# Patient Record
Sex: Male | Born: 1975
Health system: Southern US, Community
[De-identification: ages and names within clinical notes are randomized; demographics above are authoritative.]

## PROBLEM LIST (undated history)

## (undated) DIAGNOSIS — I10 Essential (primary) hypertension: Secondary | ICD-10-CM

## (undated) DIAGNOSIS — E785 Hyperlipidemia, unspecified: Secondary | ICD-10-CM

## (undated) HISTORY — DX: Hyperlipidemia, unspecified: E78.5

## (undated) HISTORY — PX: EYE SURGERY: SHX253

---

## 2016-01-30 ENCOUNTER — Emergency Department (INDEPENDENT_AMBULATORY_CARE_PROVIDER_SITE_OTHER): Payer: BLUE CROSS/BLUE SHIELD

## 2016-01-30 ENCOUNTER — Encounter (HOSPITAL_COMMUNITY): Payer: Self-pay | Admitting: Emergency Medicine

## 2016-01-30 ENCOUNTER — Emergency Department (HOSPITAL_COMMUNITY)
Admission: EM | Admit: 2016-01-30 | Discharge: 2016-01-30 | Disposition: A | Discharge status: Home / Self Care | Payer: BLUE CROSS/BLUE SHIELD | Source: Home / Self Care | Attending: Emergency Medicine | Admitting: Emergency Medicine

## 2016-01-30 DIAGNOSIS — IMO0001 Reserved for inherently not codable concepts without codable children: Secondary | ICD-10-CM

## 2016-01-30 DIAGNOSIS — J329 Chronic sinusitis, unspecified: Secondary | ICD-10-CM

## 2016-01-30 DIAGNOSIS — J189 Pneumonia, unspecified organism: Secondary | ICD-10-CM | POA: Diagnosis not present

## 2016-01-30 DIAGNOSIS — R030 Elevated blood-pressure reading, without diagnosis of hypertension: Secondary | ICD-10-CM

## 2016-01-30 LAB — POCT I-STAT, CHEM 8
BUN: 10 mg/dL (ref 6–20)
CHLORIDE: 102 mmol/L (ref 101–111)
Calcium, Ion: 1.2 mmol/L (ref 1.12–1.23)
Creatinine, Ser: 1.1 mg/dL (ref 0.61–1.24)
GLUCOSE: 103 mg/dL — AB (ref 65–99)
HEMATOCRIT: 51 % (ref 39.0–52.0)
HEMOGLOBIN: 17.3 g/dL — AB (ref 13.0–17.0)
POTASSIUM: 3.7 mmol/L (ref 3.5–5.1)
Sodium: 141 mmol/L (ref 135–145)
TCO2: 26 mmol/L (ref 0–100)

## 2016-01-30 MED ORDER — LEVOFLOXACIN 500 MG PO TABS: 500.0000 mg | ORAL_TABLET | Freq: Every day | ORAL | Status: DC

## 2016-01-30 MED ORDER — AMLODIPINE BESYLATE 10 MG PO TABS: 10.0000 mg | ORAL_TABLET | Freq: Every day | ORAL | Status: DC

## 2016-01-30 MED ORDER — PREDNISONE 50 MG PO TABS: ORAL_TABLET | ORAL | Status: DC

## 2016-01-30 NOTE — Discharge Instructions (Signed)
You have pneumonia. Take Levaquin and prednisone as prescribed. This medicine will also help with your sinuses. Take amlodipine daily. This will help with your blood pressure. Please recheck your blood pressure at the drug store in 1 week. Ideally it is below 140/90, but I would be happy with less than 160/100. Please work on finding a primary care doctor.  Calling your insurance company is a good place to start.  AllstateCommunity Resource Guide Financial Assistance The United Ways 211 is a great source of information about community services available.  Access by dialing 2-1-1 from anywhere in [place], or by website -  PooledIncome.plwww.nc211.org.   Other Local Resources (Updated 11/2015)  Financial Assistance   Services    Phone Number and Address  [facility]  Low-cost medical care - 1st and 3rd Saturday of every month  Must not qualify for public or private insurance and must have limited income [phone] [address] [place], [place]    [place] Department of Social Services  Child care  Emergency assistance for housing and Kimberly-Clarkutilities  Food stamps  Medicaid [phone] [address] [place], [place] [address]   [facility]  Low-cost medical care for children, communicable diseases, sexually-transmitted diseases, immunizations, maternity care, womens health and family planning [phone] [address] [place], [place] [address]  [facility]   Medication assistance for [facility] residents  Must meet income requirements [phone] [address] [place], [place].    [facility] Social Services  Child care  Emergency assistance for housing and Kimberly-Clarkutilities  Food stamps  Medicaid [phone] [address] [place], [place] [address]  [facility]   Low-cost medical care,   Monday through Friday, 9 am to 6 pm.   Accepts  Medicare/Medicaid, and self-pay [phone] [address] [place], [place] [address]  [facility]  Low-cost medical care - Monday through Friday, 8:30 am - 5:30 pm  Accepts Medicaid and self-pay [phone] [address] [place], [place] [address]   [facility]  Primary medical care, including for those with sickle cell disease  Accepts Medicare, Medicaid, insurance and self-pay [phone] [address] [place], [place]  [facility]   Primary medical care  Accepts Medicare, [place], insurance and self-pay [phone] [address] [place], [place] [address]   [facility] Department of Social Services  Child care  Emergency assistance for housing and Kimberly-Clarkutilities  Food stamps  Medicaid [phone] [address] [place], [place] [address]  [facility] Department of Health and [employer]Human Services  Child care  Emergency assistance for housing and Kimberly-Clarkutilities  Food stamps  Medicaid [phone] [address] [place], [place] [address]   [facility]  Medication assistance for [facility] residents with no insurance only  Must have a primary care doctor [phone] [address] [place], [place]  [facility]   Primary medical care  [place] Medicare, [place], insurance  [phone] [place], [place]  [facility]ist   Medication assistance [phone]  [facility]   Primary medical care  Accepts Medicare, [place], insurance and self-pay [phone] [address] [place], [place] [address]  [facility]   Primary medical care  Accepts Medicare, [place], insurance and self-pay [phone] [address] [place], [place] [address]  [facility] [place]  residents between the ages of 55 and 55 who do not have any  form of health insurance, Medicare, Medicaid, or VA benefits.  Services are provided free of charge to uninsured patients who fall within federal poverty guidelines.    Hours: Tuesdays and Thursdays, 4:15 - 8 pm [phone] [address] [place], [place] [address]  [facility]     Primary medical care  Dental care  Nutritional counseling  Pharmacy  Accepts Medicaid, Medicare, most insurance.  Fees are adjusted based on ability to pay.   [phone] [facility] [address] [place], [place]  [phone] [facility] [address] [place], [place]  [phone] [facility] [place], [place]  [phone] [facility], [address] [place], [place]  [phone] [facility] [address] [place], [place]  [facility]  Womens health and family planning [phone] [address] [place] [place], [place]  [facility] Department of Social Services  Child care  Emergency assistance for housing and [employer]ark  Medicaid [phone] N[address], [place], [place] [address]   [facility]    Ages 28 and older  Hours: Mondays and Thursdays, 7:00 am - 9:00 am Patients are seen on a first come, first served basis. [phone], [phone] [place], [place]  [facility] Division of Social Services  Child care  Emergency assistance for housing and [employer]ark  Medicaid [phone] [address] [place], [place] [address]  [facility]  Medication assistance  Rental assistance  Food pantry  Medication assistance  Housing assistance  Emergency food distribution  Utility assistance [phone] [address] [place], [place]  [phone]  [address] [place], [place] [address] Hours: Tuesdays and Thursdays from 9am - 12  noon by appointment only  [phone] [address] [place], [place] [address]  [facility] Lanae Boast   Accepts private insurance, [place], and [place].  Payment is based on a sliding scale for those without insurance.  Hours: Mondays, Tuesdays and Thursdays, 8:30 am - 5:30 pm.   [phone] [address] [place], [place]  [facility] - Family Medicine at [facility], [place], and [place].  Payment is based on a sliding scale for those without insurance. [phone] [address] [place], [place]  [facility] - Pediatrics at E. Scientist, research (physical sciences), [employer], and [place].  Payment is based on a sliding scale for those without insurance [phone] [address], [employer], [place]  [facility] - Pediatrics at [employer], [place], and [place].  Payment is based on a sliding scale for those without insurance. [phone] [address] [place], [place]  [facility] - Pediatrics at [facility], [place], and [place].  Payment is based on a sliding scale for those without insurance. [phone], [phone] [place], [place].    [facility]  Maternity care.  Accepts Medicaid and self-pay. [phone] [address] [place], [place]

## 2016-01-30 NOTE — ED Notes (Signed)
[facility] a [facility] and nasal congestion x 1 month.

## 2016-01-30 NOTE — ED Provider Notes (Signed)
CSN: [id number]     Arrival date & time 01/30/16  1423 History   First MD Initiated Contact with Patient 01/30/16 1610     Chief Complaint  Patient presents with  . Nasal Congestion  . Cough   (Consider location/radiation/quality/duration/timing/severity/associated sxs/prior Treatment) HPI Francisco King is a 40 y.o. male patient presenting with sinus symptoms. 1 month ago he became sick with a URI that he felt was the flu. Since then, his fever and body aches have resolved, but for the past two weeks, he continues to have cough, sinus pressure and congestion and green nasal drainage. The sinus pressure causes him to feel "dazed;" his appetite is down a bit and he cannot smell or taste his food as well as normal. He reports chills that have resolved, headache, hoarse voice from breathing through his mouth, 3/10 sore throat, and occasional nosebleeds when blowing his nose. He denies fever, vision changes, ear pain, SOB, DOE, wheezes, chest pain, nausea/vomiting/diarrhea/constipation. He has tried Tylenol, Alka-Seltzer day/night, and Benadryl, tea with lemon and cayenne pepper, with little relief.  He did not get a flu shot. He has not seen a doctor in at least 6 years. He smokes cigarettes and marijuana, occasional alcohol use, but has never done chewing tobacco. He reports family history of HTN, DM, CA.   History reviewed. No pertinent past medical history. Past Surgical History  Procedure Laterality Date  . Eye surgery     History reviewed. No pertinent family history. Social History  Substance Use Topics  . Smoking status: Current Every Day Smoker -- 0.50 packs/day for 15 years    Types: Cigarettes  . Smokeless tobacco: None  . Alcohol Use: No    Review of Systems See HPI.  Allergies  Review of patient's allergies indicates no known allergies.  Home Medications   Prior to Admission medications   Medication Sig Start Date End Date Taking? Authorizing Provider  amLODipine  (NORVASC) 10 MG tablet Take 1 tablet (10 mg total) by mouth daily. 01/30/16   Charm Rings, MD  levofloxacin (LEVAQUIN) 500 MG tablet Take 1 tablet (500 mg total) by mouth daily. 01/30/16   Charm Rings, MD  predniSONE (DELTASONE) 50 MG tablet Take 1 pill daily for 5 days. 01/30/16   Charm Rings, MD   Meds Ordered and Administered this Visit  Medications - No data to display  BP 182/118 mmHg  Pulse 78  Temp(Src) 97.6 F (36.4 C) (Oral)  SpO2 99% No data found.   Physical Exam Gen: Overweight and muscular male, no acute distress, appears mildly fatigued, cooperative, voice is mildly raspy Eyes: PERRL, EOM's intact, no conjunctival injection or scleral icterus ENT: TM's gray and intact; nasal turbinates erythematous, but non-swollen, and no polyps; no frontal/maxillary sinus tenderness to palpation/percussion; oropharynx erythematous with drainage, left tonsil is 2+ and much larger than the right, but without exudates; no uvular deviation. Neck: No cervical lymphadenopathy CV: RRR, no murmurs/rubs/gallops Lungs: Normal respiratory effort; inspiratory wheeze and expiratory rhonchi right lower lobe; expiratory rhonchi in bases of lower lobes bilaterally; bronchospasm with coughing Abd: Soft, NTND, bowel sounds present Skin: No rash noted Psych: Normal mood and affect  ED Course  Procedures (including critical care time)  Labs Review Labs Reviewed  POCT I-STAT, CHEM 8 - Abnormal; Notable for the following:    Glucose, Bld 103 (*)    Hemoglobin 17.3 (*)    All other components within normal limits    Imaging Review Dg Chest 2 View  01/30/2016  CLINICAL DATA:  Cough and congestion EXAM: CHEST  2 VIEW COMPARISON:  None. FINDINGS: Cardiomediastinal silhouette is unremarkable. There is infiltrate/ pneumonia in right lower lobe posterior lateral segment. Follow-up to resolution after appropriate treatment is recommended. Left lung is clear. No pulmonary edema. IMPRESSION:  Infiltrate/pneumonia in left lower lobe posterior lateral segment. Follow-up to resolution after appropriate treatment is recommended. Electronically Signed   By: Natasha MeadLiviu  Pop M.D.   On: 01/30/2016 17:54     Visual Acuity Review  Right Eye Distance:   Left Eye Distance:   Bilateral Distance:    Right Eye Near:   Left Eye Near:    Bilateral Near:         MDM   1. CAP (community acquired pneumonia)   2. Elevated BP   3. Other sinusitis    CXR shows "Infiltrate/pneumonia in right lower lobe posterior lateral segment." Will prescribe Levaquin and Prednisone for CAP. His blood pressure was 182/118 today in clinic. BUN 10, Creatinine 1.10. Clinically stable, no headache, vision changes. I will start him on amlodipine until he establishes with PCP.   We discussed the need to establish with a PCP and I have given him resources/information to help him find one. I would like him to follow-up soon with a PCP to manage his blood pressure and to re-check his tonsils. In the meantime he can re-check his BP at a pharmacy with goal of 140/90, but I would be happy with less than 160/100.  Patient agreed with this plan.    Charm RingsErin J Oneka Parada, MD 01/30/16 [id number]

## 2016-02-20 ENCOUNTER — Ambulatory Visit (HOSPITAL_COMMUNITY)
Admission: EM | Admit: 2016-02-20 | Discharge: 2016-02-20 | Disposition: A | Discharge status: Home / Self Care | Payer: BLUE CROSS/BLUE SHIELD | Attending: Emergency Medicine | Admitting: Emergency Medicine

## 2016-02-20 ENCOUNTER — Encounter (HOSPITAL_COMMUNITY): Payer: Self-pay | Admitting: Emergency Medicine

## 2016-02-20 DIAGNOSIS — H6592 Unspecified nonsuppurative otitis media, left ear: Secondary | ICD-10-CM

## 2016-02-20 DIAGNOSIS — R0981 Nasal congestion: Secondary | ICD-10-CM | POA: Diagnosis not present

## 2016-02-20 HISTORY — DX: Essential (primary) hypertension: I10

## 2016-02-20 MED ORDER — CETIRIZINE HCL 10 MG PO TABS: 10.0000 mg | ORAL_TABLET | Freq: Every day | ORAL | Status: DC

## 2016-02-20 MED ORDER — AMLODIPINE BESYLATE 10 MG PO TABS: 10.0000 mg | ORAL_TABLET | Freq: Every day | ORAL | Status: DC

## 2016-02-20 MED ORDER — PREDNISONE 50 MG PO TABS: ORAL_TABLET | ORAL | Status: DC

## 2016-02-20 NOTE — Discharge Instructions (Signed)
I'm glad you are feeling better. You do still have some congestion of your sinuses. This has caused some fluid behind your left ear. Some of this may be coming from all the pollen out right now. Take prednisone daily for another 5 days. I would like you to get Zyrtec and take that daily for the next week. Continue the amlodipine for a total of 3 months. Recheck your blood pressure after you have been off the amlodipine to make sure it is staying below 140/90.

## 2016-02-20 NOTE — ED Notes (Signed)
PT was seen here on 3/26. PT reports a lingering cough and ear pain and pressure in left ear. Pt reports he feels as though there is fluid in his left ear. PT reports lingering yellow congestion and sometimes produces green/yellow mucus with cough. PT reports he is feeling better, but the pressure in his ear and face is really bothering him

## 2016-02-20 NOTE — ED Provider Notes (Signed)
CSN: [id number]     Arrival date & time 02/20/16  1531 History   First MD Initiated Contact with Patient 02/20/16 1554     Chief Complaint  Patient presents with  . URI   (Consider location/radiation/quality/duration/timing/severity/associated sxs/prior Treatment) HPI He is a 40 year old man here for evaluation of sinus congestion. He was seen here about 3 weeks ago and diagnosed with pneumonia. He has completed the Levaquin and prednisone that was prescribed. He states overall he is feeling much better, but he has continued to have sinus congestion and pressure. He also reports some pressure in his left ear. He continues to have an intermittent cough, but it is much improved. He states his energy level is back, but he does fatigue a little easily. He has not been taking any medications.  He is still taking the amlodipine.  Past Medical History  Diagnosis Date  . Hypertension    Past Surgical History  Procedure Laterality Date  . Eye surgery     No family history on file. Social History  Substance Use Topics  . Smoking status: Current Every Day Smoker -- 0.50 packs/day for 15 years    Types: Cigarettes  . Smokeless tobacco: None  . Alcohol Use: No    Review of Systems As in history of present illness Allergies  Review of patient's allergies indicates no known allergies.  Home Medications   Prior to Admission medications   Medication Sig Start Date End Date Taking? Authorizing Provider  amLODipine (NORVASC) 10 MG tablet Take 1 tablet (10 mg total) by mouth daily. 02/20/16   Charm RingsErin J Morelia Cassells, MD  cetirizine (ZYRTEC) 10 MG tablet Take 1 tablet (10 mg total) by mouth daily. 02/20/16   Charm RingsErin J Serrina Minogue, MD  predniSONE (DELTASONE) 50 MG tablet Take 1 pill daily for 5 days. 02/20/16   Charm RingsErin J Donn Zanetti, MD   Meds Ordered and Administered this Visit  Medications - No data to display  BP 144/89 mmHg  Pulse 93  Temp(Src) 99.4 F (37.4 C) (Oral)  Resp 18  SpO2 96% No data  found.   Physical Exam  Constitutional: He is oriented to person, place, and time. He appears well-developed and well-nourished. No distress.  HENT:  Mouth/Throat: No oropharyngeal exudate.  onsils are symmetric.He does have some mild oropharyngeal erythema. Nasal mucosa is inflamed. Right TM is normal. Left TM has a clear effusion.  Neck: Neck supple.  Cardiovascular: Normal rate, regular rhythm and normal heart sounds.   No murmur heard. Pulmonary/Chest: Effort normal and breath sounds normal. No respiratory distress. He has no wheezes. He has no rales.  Lymphadenopathy:    He has no cervical adenopathy.  Neurological: He is alert and oriented to person, place, and time.    ED Course  Procedures (including critical care time)  Labs Review Labs Reviewed - No data to display  Imaging Review No results found.   MDM   1. Sinus congestion   2. Middle ear effusion, left    Lingering post infectious inflammation versus allergies. We'll treat with another 5 days of prednisone. Recommended taking daily Zyrtec. Recommended he continue the amlodipine for a total of 3 months. Once he stops taking the amlodipine, he should recheck his blood pressure to make sure it stays normal.    Charm RingsErin J Jessilyn Catino, MD 02/20/16 [phone]

## 2016-04-24 ENCOUNTER — Ambulatory Visit (INDEPENDENT_AMBULATORY_CARE_PROVIDER_SITE_OTHER): Payer: BLUE CROSS/BLUE SHIELD | Admitting: Family Medicine

## 2016-04-24 ENCOUNTER — Encounter: Payer: Self-pay | Admitting: Family Medicine

## 2016-04-24 VITALS — BP 116/85 | HR 72 | Temp 97.8°F | Resp 12 | Ht 69.0 in | Wt 270.0 lb

## 2016-04-24 DIAGNOSIS — I10 Essential (primary) hypertension: Secondary | ICD-10-CM | POA: Diagnosis not present

## 2016-04-24 DIAGNOSIS — Z6839 Body mass index (BMI) 39.0-39.9, adult: Secondary | ICD-10-CM | POA: Diagnosis not present

## 2016-04-24 DIAGNOSIS — J309 Allergic rhinitis, unspecified: Secondary | ICD-10-CM | POA: Insufficient documentation

## 2016-04-24 MED ORDER — AMLODIPINE BESYLATE 10 MG PO TABS: 10.0000 mg | ORAL_TABLET | Freq: Every day | ORAL | Status: DC

## 2016-04-24 MED ORDER — FLUTICASONE PROPIONATE 50 MCG/ACT NA SUSP: 1.0000 | Freq: Two times a day (BID) | NASAL | Status: DC

## 2016-04-24 NOTE — Progress Notes (Signed)
Pre visit review using our clinic review tool, if applicable. No additional management support is needed unless otherwise documented below in the visit note. 

## 2016-04-24 NOTE — Progress Notes (Signed)
HPI:   [name] is a 40 y.o. male, who is here today to establish care with me.  Former PCP: Dr Francisco King  Last preventive routine visit: Not sure.  Concerns today: feels like he has water in his left ear. No hearing loss. 5-6 months of intermittent rhinorrhea and occasional non productive cough. + Nasal congestion and sneezing. Not sure about Hx of allergies, Rx for Zyrtedcwas given a few months ago but he did not continue it.   Hypertension: Dx a couple years ago but just recently started pharmacologic treatment. Currently he is on Amlodipine 10 mg daily. He is taking medications as instructed, no side effects reported.  He has not noted unusual headache, visual changes, exertional chest pain, dyspnea,  focal weakness, or edema.    Lab Results  Component Value Date   CREATININE 1.10 01/30/2016   BUN 10 01/30/2016   NA 141 01/30/2016   K 3.7 01/30/2016   CL 102 01/30/2016    + Smoker. He exercise regularly but he is not consistent with his diet. He would like labs done today, fasting.    Review of Systems  Constitutional: Negative for fever, activity change, appetite change, fatigue and unexpected weight change.  HENT: Positive for congestion, postnasal drip, rhinorrhea and sneezing. Negative for nosebleeds, sore throat and trouble swallowing.   Eyes: Positive for itching. Negative for redness and visual disturbance.  Respiratory: Positive for cough. Negative for apnea, shortness of breath and wheezing.   Cardiovascular: Negative for chest pain, palpitations and leg swelling.  Gastrointestinal: Negative for nausea, vomiting and abdominal pain.       No changes in bowel habits.  Genitourinary: Negative for dysuria, hematuria and decreased urine volume.  Musculoskeletal: Positive for back pain (occasionally, associated with wt lifting.).  Allergic/Immunologic: Positive for environmental allergies.  Neurological: Negative for dizziness, seizures,  weakness, numbness and headaches.  Hematological: Negative for adenopathy. Does not bruise/bleed easily.  Psychiatric/Behavioral: Negative for confusion and sleep disturbance. The patient is not nervous/anxious.       Current Outpatient Prescriptions on File Prior to Visit  Medication Sig Dispense Refill  . cetirizine (ZYRTEC) 10 MG tablet Take 1 tablet (10 mg total) by mouth daily. 30 tablet 0   No current facility-administered medications on file prior to visit.     Past Medical History  Diagnosis Date  . Hypertension    No Known Allergies  History reviewed. No pertinent family history.  Social History   Social History  . Marital Status: Single    Spouse Name: N/A  . Number of Children: N/A  . Years of Education: N/A   Social History Main Topics  . Smoking status: Current Every Day Smoker -- 0.50 packs/day for 15 years    Types: Cigarettes  . Smokeless tobacco: None  . Alcohol Use: No  . Drug Use: No  . Sexual Activity: Not Asked   Other Topics Concern  . None   Social History Narrative    Filed Vitals:   04/24/16 1519  BP: 116/85  Pulse: 72  Temp: 97.8 F (36.6 C)  Resp: 12    Body mass index is 39.85 kg/(m^2).   SpO2 Readings from Last 3 Encounters:  04/24/16 98%  02/20/16 96%  01/30/16 99%      Physical Exam  Constitutional: He is oriented to person, place, and time. He appears well-developed. No distress.  HENT:  Head: Atraumatic.  Right Ear: Hearing, tympanic membrane and ear canal normal.  Left Ear:  Hearing, tympanic membrane and ear canal normal.  Nose: Right sinus exhibits no maxillary sinus tenderness and no frontal sinus tenderness. Left sinus exhibits no maxillary sinus tenderness and no frontal sinus tenderness.  Mouth/Throat: Uvula is midline, oropharynx is clear and moist and mucous membranes are normal.  Hypertrophic turbinates.  Eyes: Conjunctivae and EOM are normal. Pupils are equal, round, and reactive to light.    Cardiovascular: Normal rate and regular rhythm.   No murmur heard. Pulses:      Dorsalis pedis pulses are 2+ on the right side, and 2+ on the left side.  Respiratory: Effort normal and breath sounds normal. No respiratory distress.  GI: Soft. He exhibits no mass. There is no hepatomegaly. There is no tenderness.  Musculoskeletal: He exhibits no edema or tenderness.   No tenderness upon palpation of paraspinal muscles.     Lymphadenopathy:    He has no cervical adenopathy.  Neurological: He is alert and oriented to person, place, and time. He has normal strength.  Skin: Skin is warm. No erythema.  Psychiatric: He has a normal mood and affect.  Well groomed, good eye contact.      ASSESSMENT AND PLAN:     Francisco King was seen today for new patient (initial visit).  Diagnoses and all orders for this visit:  Essential hypertension, benign  Adequately controlled. No changes in current management. We may be able to decrease dose of Amlodipine if he loses some wt and improves diet. DASH diet recommended. Eye exam recommended annually. F/U in 4 months, before if needed.  -     Lipid panel -     TSH -     Hemoglobin A1c -     Basic metabolic panel -     amLODipine (NORVASC) 10 MG tablet; Take 1 tablet (10 mg total) by mouth daily.  BMI 39.0-39.9,adult  We discussed benefits of wt loss as well as adverse effects of obesity. Consistency with healthy diet and physical activity recommended. Healthy diet + daily brisk walking for 15-30 min as tolerated.   -     Lipid panel -     Hemoglobin A1c  Allergic rhinitis, unspecified allergic rhinitis type  Flonase nasal spray and OTC Allegra 180 mg or Zyrtec 10 mg daily. Avoid decongestants. F/U in 4 months.  -     fluticasone (FLONASE) 50 MCG/ACT nasal spray; Place 1 spray into both nostrils 2 (two) times daily.         -PMHx reviewed. -Will obtain records from former PCP.        Francisco Hartmann G. [place], MD  [facility]. [place] office.

## 2016-04-24 NOTE — Patient Instructions (Signed)
A few things to remember from today's visit:   1. Essential hypertension, benign  - Lipid panel - TSH - Hemoglobin A1c - Basic metabolic panel - amLODipine (NORVASC) 10 MG tablet; Take 1 tablet (10 mg total) by mouth daily.  Dispense: 90 tablet; Refill: 1  2. BMI 39.0-39.9,adult  - Lipid panel - Hemoglobin A1c  3. Allergic rhinitis, unspecified allergic rhinitis type  - fluticasone (FLONASE) 50 MCG/ACT nasal spray; Place 1 spray into both nostrils 2 (two) times daily.  Dispense: 16 g; Refill: 3   What are some tips for weight loss? People become overweight for many reasons. Weight issues can run in families. They can be caused by unhealthy behaviors and a person's environment. Certain health problems and medicines can also lead to weight gain. There are some simple things you can do to reach and maintain a healthy weight:  Eat 500 fewer calories per day than your body needs to maintain your weight. Women should aim for no more than 1,200 to 1,500 calories per day. Men should aim for 1,500 to 1,800 calories per day. Avoid sweet drinks. These include regular soft drinks, fruit juices, fruit drinks, energy drinks, sweetened iced tea, and flavored milk. Avoid fast foods. Fast foods such as french fries, hamburgers, chicken nuggets, and pizza are high in calories and can cause weight gain. Eat a healthy breakfast. People who skip breakfast tend to weigh more. Don't watch more than two hours of television per day. Chew sugar-free gum between meals to cut down on snacking. Avoid grocery shopping when you're hungry. Pack a healthy lunch instead of eating out to control what and how much you eat. Eat a lot of fruits and vegetables. Aim for about 2 cups of fruit and 2 to 3 cups of vegetables per day. Aim for 150 minutes per week of moderate-intensity exercise (such as brisk walking), or 75 minutes per week of vigorous exercise (such as jogging or running). Be more active. Small changes in  physical activity can easily be added to your daily routine. For example, take the stairs instead of the elevator. Take a walk with your family. A daily walk is a great way to get exercise and to catch up on the day's events.  DASH diet recommended: high in vegetables, fruits, low-fat dairy products, whole grains, poultry, fish, and nuts; and low in sweets, sugar-sweetened beverages, and red meats.     If you sign-up for My chart, you can communicate easier with us in case you have any question or concern.

## 2016-04-25 LAB — LIPID PANEL
CHOL/HDL RATIO: 6
CHOLESTEROL: 206 mg/dL — AB (ref 0–200)
HDL: 33.1 mg/dL — ABNORMAL LOW (ref >39.00)
NonHDL: 172.74
TRIGLYCERIDES: 206 mg/dL — AB (ref 0.0–149.0)
VLDL: 41.2 mg/dL — ABNORMAL HIGH (ref 0.0–40.0)

## 2016-04-25 LAB — TSH: TSH: 1.12 u[IU]/mL (ref 0.35–4.50)

## 2016-04-25 LAB — LDL CHOLESTEROL, DIRECT: Direct LDL: 130 mg/dL

## 2016-04-25 LAB — BASIC METABOLIC PANEL
BUN: 10 mg/dL (ref 6–23)
CALCIUM: 9.9 mg/dL (ref 8.4–10.5)
CO2: 26 meq/L (ref 19–32)
Chloride: 105 mEq/L (ref 96–112)
Creatinine, Ser: 1.1 mg/dL (ref 0.40–1.50)
GFR: 79.54 mL/min (ref >60.00)
Glucose, Bld: 89 mg/dL (ref 70–99)
POTASSIUM: 3.6 meq/L (ref 3.5–5.1)
SODIUM: 138 meq/L (ref 135–145)

## 2016-04-25 LAB — HEMOGLOBIN A1C: HEMOGLOBIN A1C: 5.4 % (ref 4.6–6.5)

## 2016-04-26 ENCOUNTER — Telehealth: Payer: Self-pay | Admitting: Family Medicine

## 2016-04-26 NOTE — Telephone Encounter (Signed)
Called pt back, see other phone note. 

## 2016-04-26 NOTE — Telephone Encounter (Signed)
Pt is returning sarah call °

## 2016-08-07 ENCOUNTER — Encounter: Payer: Self-pay | Admitting: Family Medicine

## 2016-08-07 ENCOUNTER — Ambulatory Visit (INDEPENDENT_AMBULATORY_CARE_PROVIDER_SITE_OTHER): Payer: BLUE CROSS/BLUE SHIELD | Admitting: Family Medicine

## 2016-08-07 VITALS — BP 120/82 | HR 81 | Temp 97.9°F | Resp 12 | Ht 69.0 in | Wt 252.5 lb

## 2016-08-07 DIAGNOSIS — Z6837 Body mass index (BMI) 37.0-37.9, adult: Secondary | ICD-10-CM

## 2016-08-07 DIAGNOSIS — Z23 Encounter for immunization: Secondary | ICD-10-CM | POA: Diagnosis not present

## 2016-08-07 DIAGNOSIS — I10 Essential (primary) hypertension: Secondary | ICD-10-CM | POA: Diagnosis not present

## 2016-08-07 DIAGNOSIS — J309 Allergic rhinitis, unspecified: Secondary | ICD-10-CM

## 2016-08-07 DIAGNOSIS — E782 Mixed hyperlipidemia: Secondary | ICD-10-CM | POA: Diagnosis not present

## 2016-08-07 DIAGNOSIS — E669 Obesity, unspecified: Secondary | ICD-10-CM | POA: Insufficient documentation

## 2016-08-07 LAB — LIPID PANEL
CHOL/HDL RATIO: 6
Cholesterol: 197 mg/dL (ref 0–200)
HDL: 31.1 mg/dL — ABNORMAL LOW (ref >39.00)
NONHDL: 165.9
Triglycerides: 336 mg/dL — ABNORMAL HIGH (ref 0.0–149.0)
VLDL: 67.2 mg/dL — ABNORMAL HIGH (ref 0.0–40.0)

## 2016-08-07 LAB — HEPATIC FUNCTION PANEL
ALBUMIN: 4.2 g/dL (ref 3.5–5.2)
ALK PHOS: 60 U/L (ref 39–117)
ALT: 31 U/L (ref 0–53)
AST: 32 U/L (ref 0–37)
BILIRUBIN DIRECT: 0 mg/dL (ref 0.0–0.3)
Total Bilirubin: 0.3 mg/dL (ref 0.2–1.2)
Total Protein: 7.3 g/dL (ref 6.0–8.3)

## 2016-08-07 LAB — LDL CHOLESTEROL, DIRECT: LDL DIRECT: 123 mg/dL

## 2016-08-07 MED ORDER — AMLODIPINE BESYLATE 10 MG PO TABS: 10.0000 mg | ORAL_TABLET | Freq: Every day | ORAL | 1 refills | Status: DC

## 2016-08-07 NOTE — Progress Notes (Signed)
HPI:   [name] is a 40 y.o. male, who is here today to follow on some of his chronic medical problems.   He was seen last 04/24/2016, when treatment for allergic rhinitis was started.  He has noted improvement in nasal congestion and rhinorrhea, he states that some symptoms like nasal congestion started again 2-3 days ago. He is currently on Flonase intranasal spray twice daily and Zyrtec 10 mg daily. He denies any fever, chills, sore throat, cough, or wheezing. No sick contact or recent travel.  He would like his flu shot today.  Since his last OV, he has followed a healthier diet, he is exercising regularly, mainly lifting weights. He is also reporting that he has not consumed marijuana since August 2017.    Hypertension: Currently he is on Amlodipine 10 mg daily. No side effects from medication. He denies any frequent/severe headache, visual changes, chest pain, dyspnea, palpitation, abdominal pain, nausea, vomiting, or edema.   Lab Results  Component Value Date   CREATININE 1.10 04/24/2016   BUN 10 04/24/2016   NA 138 04/24/2016   K 3.6 04/24/2016   CL 105 04/24/2016   CO2 26 04/24/2016   He is not checking BP at home.  Hyperlipidemia:  Currently on non-pharmacologic treatment.   Lab Results  Component Value Date   CHOL 206 (H) 04/24/2016   HDL 33.10 (L) 04/24/2016   LDLDIRECT 130.0 04/24/2016   TRIG 206.0 (H) 04/24/2016   CHOLHDL 6 04/24/2016      Review of Systems  Constitutional: Negative for activity change, appetite change, fatigue, fever and unexpected weight change.  HENT: Positive for congestion and postnasal drip. Negative for nosebleeds, rhinorrhea, sinus pressure, sneezing, sore throat, trouble swallowing and voice change.   Eyes: Negative for redness and visual disturbance.  Respiratory: Negative for cough, shortness of breath and wheezing.   Cardiovascular: Negative for chest pain, palpitations and leg swelling.    Gastrointestinal: Negative for abdominal pain, nausea and vomiting.  Genitourinary: Negative for decreased urine volume, difficulty urinating and hematuria.  Musculoskeletal: Negative for gait problem and myalgias.  Skin: Negative for color change and rash.  Neurological: Negative for syncope, weakness, numbness and headaches.  Psychiatric/Behavioral: Negative for confusion and sleep disturbance. The patient is not nervous/anxious.       Current Outpatient Prescriptions on File Prior to Visit  Medication Sig Dispense Refill  . cetirizine (ZYRTEC) 10 MG tablet Take 1 tablet (10 mg total) by mouth daily. 30 tablet 0  . fluticasone (FLONASE) 50 MCG/ACT nasal spray Place 1 spray into both nostrils 2 (two) times daily. 16 g 3   No current facility-administered medications on file prior to visit.      Past Medical History:  Diagnosis Date  . Hyperlipidemia   . Hypertension    No Known Allergies  Social History   Social History  . Marital status: Single    Spouse name: N/A  . Number of children: N/A  . Years of education: N/A   Social History Main Topics  . Smoking status: Current Every Day Smoker    Packs/day: 0.50    Years: 15.00    Types: Cigarettes  . Smokeless tobacco: None  . Alcohol use No  . Drug use: No  . Sexual activity: Not Asked   Other Topics Concern  . None   Social History Narrative  . None    Vitals:   08/07/16 0841  BP: 120/82  Pulse: 81  Resp: 12  Temp: 97.9 F (36.6 C)     Body mass index is 37.29 kg/m.   Wt Readings from Last 3 Encounters:  08/07/16 252 lb 8 oz (114.5 kg)  04/24/16 270 lb (122.5 kg)      Physical Exam  Nursing note and vitals reviewed. Constitutional: He is oriented to person, place, and time. He appears well-developed. No distress.  HENT:  Head: Atraumatic.  Nose: No rhinorrhea.  Mouth/Throat: Oropharynx is clear and moist and mucous membranes are normal.  Hypertrophic turbinates.  Eyes: Conjunctivae and  EOM are normal. Pupils are equal, round, and reactive to light.  Neck: No thyroid mass and no thyromegaly present.  Cardiovascular: Normal rate and regular rhythm.   No murmur heard. Pulses:      Dorsalis pedis pulses are 2+ on the right side, and 2+ on the left side.  Respiratory: Effort normal and breath sounds normal. No respiratory distress.  GI: Soft. He exhibits no mass. There is no hepatomegaly. There is no tenderness.  Musculoskeletal: He exhibits no edema or tenderness.  Lymphadenopathy:    He has no cervical adenopathy.  Neurological: He is alert and oriented to person, place, and time. Coordination and gait normal.  Skin: Skin is warm. No erythema.  Psychiatric: He has a normal mood and affect.  Well groomed, good eye contact.      ASSESSMENT AND PLAN:     Francisco King was seen today for follow-up.  Diagnoses and all orders for this visit:  Essential hypertension, benign  Adequately controlled. No changes in current management. DASH-low salt diet to continue. Periodic eye exam. F/U in 6 months, before if needed.  -     amLODipine (NORVASC) 10 MG tablet; Take 1 tablet (10 mg total) by mouth daily. -     Hepatic Function Panel  Mixed hyperlipidemia  For now he will continue nonpharmacologic treatment. Further recommendations in regard to pharmacologic treatment would be given according to lab results.  -     Lipid panel -     Hepatic Function Panel  Chronic allergic rhinitis, unspecified seasonality, unspecified trigger  Improved. Avoid OTC cold medications or decongestants. Amlodipine could aggravate nasal congestion, so we may need to consider decreasing dose if needed depending of symptoms. No changes on Flonase intranasal spray or OTC antihistaminic. Recommended starting nasal saline rinse nose at night. Follow-up as needed.  BMI 37.0-37.9, adult  He lost about 18 pounds since his last OV, June 2017.  Discussed the importance of continuing a  healthful diet and regular physical activity. Recommended adding some aerobic exercise to his exercise routine.  -     Hepatic Function Panel  Need for immunization against influenza -     Flu Vaccine QUAD 36+ mos IM       -Francisco King was advised to return sooner than planned today if new concerns arise. Planning on CPE next OV.       Francisco Stash G. [place], MD  [facility]. [place] office.

## 2016-08-07 NOTE — Progress Notes (Signed)
Pre visit review using our clinic review tool, if applicable. No additional management support is needed unless otherwise documented below in the visit note. 

## 2016-08-07 NOTE — Patient Instructions (Addendum)
A few things to remember from today's visit:   Essential hypertension, benign - Plan: CMP, amLODipine (NORVASC) 10 MG tablet  Mixed hyperlipidemia - Plan: CMP, Lipid panel  Chronic allergic rhinitis, unspecified seasonality, unspecified trigger  BMI 37.0-37.9, adult  Continue working on healthy diet and regular exercise, aerobic exercise: Brisk walking, treadmill, elliptical, or aerobic class. Continue weightlifting twice per week.  If cholesterol is still elevated medication would be recommended.  Nasal saline recommended at night to increase nose right before using Flonase. No changes and cetirizine.   Please be sure medication list is accurate. If a new problem present, please set up appointment sooner than planned today.

## 2016-08-18 ENCOUNTER — Other Ambulatory Visit: Payer: Self-pay

## 2016-08-18 MED ORDER — ATORVASTATIN CALCIUM 20 MG PO TABS: ORAL_TABLET | ORAL | 3 refills | Status: DC

## 2017-01-21 ENCOUNTER — Other Ambulatory Visit: Payer: Self-pay | Admitting: Family Medicine

## 2017-01-21 DIAGNOSIS — J309 Allergic rhinitis, unspecified: Secondary | ICD-10-CM

## 2017-01-29 ENCOUNTER — Other Ambulatory Visit (INDEPENDENT_AMBULATORY_CARE_PROVIDER_SITE_OTHER): Payer: BLUE CROSS/BLUE SHIELD

## 2017-01-29 ENCOUNTER — Other Ambulatory Visit: Payer: Self-pay | Admitting: Family Medicine

## 2017-01-29 DIAGNOSIS — E782 Mixed hyperlipidemia: Secondary | ICD-10-CM

## 2017-01-29 DIAGNOSIS — I10 Essential (primary) hypertension: Secondary | ICD-10-CM | POA: Diagnosis not present

## 2017-01-29 DIAGNOSIS — Z114 Encounter for screening for human immunodeficiency virus [HIV]: Secondary | ICD-10-CM

## 2017-01-29 LAB — COMPREHENSIVE METABOLIC PANEL
ALBUMIN: 4.4 g/dL (ref 3.5–5.2)
ALK PHOS: 56 U/L (ref 39–117)
ALT: 36 U/L (ref 0–53)
AST: 27 U/L (ref 0–37)
BUN: 17 mg/dL (ref 6–23)
CALCIUM: 9.4 mg/dL (ref 8.4–10.5)
CO2: 25 mEq/L (ref 19–32)
Chloride: 108 mEq/L (ref 96–112)
Creatinine, Ser: 1.06 mg/dL (ref 0.40–1.50)
GFR: 100.05 mL/min (ref >60.00)
Glucose, Bld: 100 mg/dL — ABNORMAL HIGH (ref 70–99)
POTASSIUM: 4.2 meq/L (ref 3.5–5.1)
Sodium: 139 mEq/L (ref 135–145)
TOTAL PROTEIN: 6.8 g/dL (ref 6.0–8.3)
Total Bilirubin: 0.4 mg/dL (ref 0.2–1.2)

## 2017-01-29 LAB — LIPID PANEL
CHOLESTEROL: 190 mg/dL (ref 0–200)
HDL: 36.8 mg/dL — AB (ref >39.00)
NonHDL: 153.06
TRIGLYCERIDES: 348 mg/dL — AB (ref 0.0–149.0)
Total CHOL/HDL Ratio: 5
VLDL: 69.6 mg/dL — AB (ref 0.0–40.0)

## 2017-01-29 LAB — LDL CHOLESTEROL, DIRECT: LDL DIRECT: 120 mg/dL

## 2017-01-30 LAB — HIV ANTIBODY (ROUTINE TESTING W REFLEX): HIV: NONREACTIVE

## 2017-02-04 NOTE — Progress Notes (Signed)
HPI:  [name] is a 41 y.o.male here today for his routine physical examination.  He lives with girlfriend.  Regular exercise 3 or more times per week: Yes,mainly wt lifting. Following a healthy diet: Has ben working on a healthier diet, still he eats ice cream daily.   Chronic medical problems: HTN, tobacco use disorder,HLD.  He discontinued statin and antihypertensive meds 1-2 months ago. He wanted ti try non pharmacologic treatment.  He is not checking BP's at home. Denies severe/frequent headache, visual changes, chest pain, dyspnea, palpitation, claudication, focal weakness, or edema.  Hx of STD's: CT a few years ago.   Immunization History  Administered Date(s) Administered  . Influenza,inj,Quad PF,36+ Mos 08/07/2016  . Tdap 02/05/2017     -Denies high alcohol intake or Hx of illicit drug use. + Smoker.   He had labs done recently:  Lab Results  Component Value Date   CHOL 190 01/29/2017   HDL 36.80 (L) 01/29/2017   LDLDIRECT 120.0 01/29/2017   TRIG 348.0 (H) 01/29/2017   CHOLHDL 5 01/29/2017   Lab Results  Component Value Date   CREATININE 1.06 01/29/2017   BUN 17 01/29/2017   NA 139 01/29/2017   K 4.2 01/29/2017   CL 108 01/29/2017   CO2 25 01/29/2017   Lab Results  Component Value Date   ALT 36 01/29/2017   AST 27 01/29/2017   ALKPHOS 56 01/29/2017   BILITOT 0.4 01/29/2017    Review of Systems  Constitutional: Negative for activity change, appetite change, fatigue, fever and unexpected weight change.  HENT: Negative for dental problem, mouth sores, nosebleeds, sore throat and trouble swallowing.   Eyes: Negative for redness and visual disturbance.  Respiratory: Negative for cough, shortness of breath and wheezing.   Cardiovascular: Negative for chest pain, palpitations and leg swelling.  Gastrointestinal: Negative for abdominal pain, blood in stool, nausea and vomiting.       No changes in bowel habits.  Endocrine:  Negative for polydipsia, polyphagia and polyuria.  Genitourinary: Negative for decreased urine volume, difficulty urinating, dysuria, genital sores, hematuria and testicular pain.  Musculoskeletal: Negative for gait problem, joint swelling and myalgias.  Skin: Negative for color change and rash.  Neurological: Negative for dizziness, syncope, weakness and headaches.  Hematological: Negative for adenopathy. Does not bruise/bleed easily.  Psychiatric/Behavioral: Negative for confusion and sleep disturbance. The patient is not nervous/anxious.      Current Outpatient Prescriptions on File Prior to Visit  Medication Sig Dispense Refill  . cetirizine (ZYRTEC) 10 MG tablet Take 1 tablet (10 mg total) by mouth daily. 30 tablet 0  . fluticasone (FLONASE) 50 MCG/ACT nasal spray PLACE 1 SPRAY INTO BOTH NOSTRILS 2 TIMES DAILY. 16 g 3   No current facility-administered medications on file prior to visit.      Past Medical History:  Diagnosis Date  . Hyperlipidemia   . Hypertension     No Known Allergies  Family History  Problem Relation Age of Onset  . Cancer Father     lung  . Cancer Mother     breast  . Hypertension Mother   . Cancer Sister     breast  . Diabetes Maternal Grandmother   . Hypertension Maternal Grandmother     Social History   Social History  . Marital status: Single    Spouse name: N/A  . Number of children: N/A  . Years of education: N/A   Social History Main Topics  . Smoking  status: Current Every Day Smoker    Packs/day: 0.50    Years: 15.00    Types: Cigarettes  . Smokeless tobacco: Never Used  . Alcohol use No  . Drug use: No  . Sexual activity: Not Asked   Other Topics Concern  . None   Social History Narrative  . None     Vitals:   02/05/17 0906 02/05/17 0952  BP: 118/80 125/80  Pulse: 77   Resp: 12    Body mass index is 34.89 kg/m.  (3)@  Wt Readings from Last 3 Encounters:  08/07/16 252 lb 8 oz (114.5 kg)  04/24/16  270 lb (122.5 kg)    Physical Exam  Nursing note and vitals reviewed. Constitutional: He is oriented to person, place, and time. He appears well-developed. No distress.  HENT:  Head: Atraumatic.  Right Ear: Hearing, tympanic membrane, external ear and ear canal normal.  Left Ear: Hearing, tympanic membrane, external ear and ear canal normal.  Mouth/Throat: Oropharynx is clear and moist and mucous membranes are normal.  Eyes: Conjunctivae and EOM are normal. Pupils are equal, round, and reactive to light.  Neck: Normal range of motion. No tracheal deviation present. No thyromegaly present.  Cardiovascular: Normal rate and regular rhythm.   No murmur heard. Pulses:      Dorsalis pedis pulses are 2+ on the right side, and 2+ on the left side.  Respiratory: Effort normal and breath sounds normal. No respiratory distress.  GI: Soft. He exhibits no mass. There is no hepatomegaly. There is no tenderness.  Genitourinary:  Genitourinary Comments: no concerns.  Musculoskeletal: He exhibits no edema or tenderness.  No major deformities appreciated and no signs of synovitis.  Lymphadenopathy:    He has no cervical adenopathy.       Right: No supraclavicular adenopathy present.       Left: No supraclavicular adenopathy present.  Neurological: He is alert and oriented to person, place, and time. He has normal strength. No cranial nerve deficit or sensory deficit. Coordination and gait normal.  Reflex Scores:      Bicep reflexes are 2+ on the right side and 2+ on the left side.      Patellar reflexes are 2+ on the right side and 2+ on the left side. Skin: Skin is warm. No erythema.  Psychiatric: He has a normal mood and affect. Cognition and memory are normal.  Well groomed,good eye contact.    ASSESSMENT AND PLAN:   Discussed the following assessment and plan:  Francisco King was seen today for annual exam.  Diagnoses and all orders for this visit:  Routine general medical examination at a  health care facility   We discussed the importance of regular physical activity and healthy diet for prevention of chronic illness and/or complications. Recommend adding aerobic exercise to his work-up routine. Smoking cessation encouraged,adverse effects discussed. Preventive guidelines reviewed. Vaccination up to date. STD prevention. Next CPE in 1 year.   Mixed hyperlipidemia  He agrees with resuming statin medication,which he tolerated well. Low fat diet discussed. Lipitor 20 mg daily. F/U in 6 months.  -     atorvastatin (LIPITOR) 20 MG tablet; Take 1 tablet by mouth daily with supper.  Need for diphtheria-tetanus-pertussis (Tdap) vaccine, adult/adolescent -     Tdap vaccine greater than or equal to 7yo IM  Essential hypertension, benign  Continue non pharmacologic treatment. Monitor BP at home periodically. Low salt diet and eye exam also recommended.    Return in 6 months (on  08/07/2017).    Lizbett Garciagarcia G. [place], MD  [facility]. [place] office.

## 2017-02-05 ENCOUNTER — Ambulatory Visit (INDEPENDENT_AMBULATORY_CARE_PROVIDER_SITE_OTHER): Payer: BLUE CROSS/BLUE SHIELD | Admitting: Family Medicine

## 2017-02-05 ENCOUNTER — Encounter: Payer: Self-pay | Admitting: Family Medicine

## 2017-02-05 VITALS — BP 125/80 | HR 77 | Resp 12 | Ht 69.0 in | Wt 236.2 lb

## 2017-02-05 DIAGNOSIS — I10 Essential (primary) hypertension: Secondary | ICD-10-CM | POA: Diagnosis not present

## 2017-02-05 DIAGNOSIS — Z23 Encounter for immunization: Secondary | ICD-10-CM

## 2017-02-05 DIAGNOSIS — E782 Mixed hyperlipidemia: Secondary | ICD-10-CM | POA: Diagnosis not present

## 2017-02-05 DIAGNOSIS — Z0000 Encounter for general adult medical examination without abnormal findings: Secondary | ICD-10-CM

## 2017-02-05 MED ORDER — ATORVASTATIN CALCIUM 20 MG PO TABS: ORAL_TABLET | ORAL | 2 refills | Status: DC

## 2017-02-05 NOTE — Patient Instructions (Addendum)
A few things to remember from today's visit:   Mixed hyperlipidemia - Plan: atorvastatin (LIPITOR) 20 MG tablet  Routine general medical examination at a health care facility    At least 150 minutes of moderate exercise per week, daily brisk walking for 15-30 min is a good exercise option. Healthy diet low in saturated (animal) fats and sweets and consisting of fresh fruits and vegetables, lean meats such as fish and white chicken and whole grains.  - Vaccines:  Tdap vaccine every 10 years.  Shingles vaccine recommended at age 59, could be given after 39 years of age but not sure about insurance coverage.  Pneumonia vaccines:  Prevnar 13 at 65 and Pneumovax at 16.   -Screening recommendations for low/normal risk males:  Screening for diabetes at age 63-45 and every 3 years.     Colonoscopy for colon cancer screening at age 54 and until age 56.  Prostate cancer screening: some controversy.     Monitor blood pressure at home.    Please be sure medication list is accurate. If a new problem present, please set up appointment sooner than planned today.

## 2017-02-05 NOTE — Progress Notes (Signed)
Pre visit review using our clinic review tool, if applicable. No additional management support is needed unless otherwise documented below in the visit note. 

## 2017-02-11 ENCOUNTER — Encounter: Payer: Self-pay | Admitting: Family Medicine

## 2017-08-05 NOTE — Progress Notes (Signed)
HPI:   [name] is a 41 y.o. male, who is here today to follow on some chronic medical problems.  He was last seen on 02/05/17 for his CPE.  Hyperlipidemia:  Currently he is on non pharmacologic treatment, in 02/2017 I recommended resuming Atorvastatin 20 mg, he did not do so.  Following a low fat diet: "A lot of better.".   Lab Results  Component Value Date   CHOL 190 01/29/2017   HDL 36.80 (L) 01/29/2017   LDLDIRECT 120.0 01/29/2017   TRIG 348.0 (H) 01/29/2017   CHOLHDL 5 01/29/2017    Hypertension:   Currently on non pharmacologic treatment. She was on Amlodipine 10 mg and discontinued medication a few months ago. Home BP readings: Not checking.  He has not noted unusual headache, visual changes, exertional chest pain, dyspnea,  focal weakness, or edema.   Lab Results  Component Value Date   CREATININE 1.06 01/29/2017   BUN 17 01/29/2017   NA 139 01/29/2017   K 4.2 01/29/2017   CL 108 01/29/2017   CO2 25 01/29/2017   He is still exercising regularly and trying to follow a healthy diet.   Concerns today:   A month ago he did hurt his right toe while playing basketball, another player stepped on his toe.  Pain,edema, and erythema developed right after injury. Pain is greatly better and edema has resolved but concerned because still having pain on  lateral aspect of MTP joint. Pain is exacerbated by big toe flexion,certain shoe wear (boots), and by prolonged walking.  Achy pain, 3/10.  OTC topical Bangay and Epson salt. Denies deformity or limitation of ROM.    Review of Systems  Constitutional: Negative for activity change, appetite change, fatigue and fever.  HENT: Negative for nosebleeds, sore throat and trouble swallowing.   Eyes: Negative for redness and visual disturbance.  Respiratory: Negative for cough, shortness of breath and wheezing.   Cardiovascular: Negative for chest pain, palpitations and leg swelling.    Gastrointestinal: Negative for abdominal pain, nausea and vomiting.       No changes in bowel habits.  Genitourinary: Negative for decreased urine volume and hematuria.  Musculoskeletal: Positive for arthralgias. Negative for gait problem and joint swelling.  Skin: Negative for pallor and rash.  Neurological: Negative for syncope, weakness, numbness and headaches.      Current Outpatient Prescriptions on File Prior to Visit  Medication Sig Dispense Refill  . fluticasone (FLONASE) 50 MCG/ACT nasal spray PLACE 1 SPRAY INTO BOTH NOSTRILS 2 TIMES DAILY. 16 g 3   No current facility-administered medications on file prior to visit.      Past Medical History:  Diagnosis Date  . Hyperlipidemia   . Hypertension    No Known Allergies  Social History   Social History  . Marital status: Single    Spouse name: N/A  . Number of children: N/A  . Years of education: N/A   Social History Main Topics  . Smoking status: Current Every Day Smoker    Packs/day: 0.50    Years: 15.00    Types: Cigarettes  . Smokeless tobacco: Never Used  . Alcohol use No  . Drug use: No  . Sexual activity: Not Asked   Other Topics Concern  . None   Social History Narrative  . None    Vitals:   08/06/17 0942  BP: 132/80  Pulse: 60  Resp: 12  SpO2: 97%   Body mass index is 34.48  kg/m.   Wt Readings from Last 3 Encounters:  08/06/17 233 lb 8 oz (105.9 kg)  02/05/17 236 lb 4 oz (107.2 kg)  08/07/16 252 lb 8 oz (114.5 kg)    Physical Exam  Nursing note and vitals reviewed. Constitutional: He is oriented to person, place, and time. He appears well-developed. No distress.  HENT:  Head: Normocephalic and atraumatic.  Mouth/Throat: Oropharynx is clear and moist and mucous membranes are normal.  Eyes: Pupils are equal, round, and reactive to light. Conjunctivae are normal.  Cardiovascular: Normal rate and regular rhythm.   No murmur heard. Pulses:      Dorsalis pedis pulses are 2+ on the  right side, and 2+ on the left side.  Respiratory: Effort normal and breath sounds normal. No respiratory distress.  GI: Soft. He exhibits no mass. There is no hepatomegaly. There is no tenderness.  Musculoskeletal: He exhibits no edema.       Right foot: There is tenderness. There is normal range of motion, no bony tenderness, no swelling and no deformity.  Right foot: Normal ankle ROM and MTP ROM. ROM of hallux does not elicits pain of MTP joint. Mild pain with palpation of lateral aspect of first MTP joint. No edema. + Mild bunion.   Lymphadenopathy:    He has no cervical adenopathy.  Neurological: He is alert and oriented to person, place, and time. He has normal strength. Gait normal.  Skin: Skin is warm. No rash noted. No erythema.  Psychiatric: He has a normal mood and affect.  Well groomed, good eye contact.    ASSESSMENT AND PLAN:  Francisco King was seen today for follow-up.  Diagnoses and all orders for this visit:  Lab Results  Component Value Date   CHOL 202 (H) 08/06/2017   HDL 44.90 08/06/2017   LDLCALC 134 (H) 08/06/2017   LDLDIRECT 120.0 01/29/2017   TRIG 117.0 08/06/2017   CHOLHDL 5 08/06/2017    Injury of toe on right foot, initial encounter  Pain is improving. I do not think imaging is needed at this time. Recommend wearing wider shoe wear. Continue topical menthol like products OTC, Asper cream with Lidocaine is an option. If worsening I recommend arranging appt with podiatrists.  Essential hypertension, benign  Adequately controlled on non pharmacologic treatment. No changes in current management. DASH-low salt diet recommended. Eye exam recommended annually.   Class 2 obesity without serious comorbidity with body mass index (BMI) of 37.0 to 37.9 in adult, unspecified obesity type  He lost 3 more pounds sine 02/2017. We discussed benefits of wt loss as well as adverse effects of obesity. Encouraged to continue with consistent healthy diet and  physical activity.   Mixed hyperlipidemia  Dx discussed. Explained that sometimes pharmacologic treatment is needed along with low fat diet. Possible complications of HLD discussed. Further recommendations will be given according to labs/imaging results.  -     Lipid panel  Need for prophylactic vaccination and inoculation against influenza -     Flu Vaccine QUAD 36+ mos IM     -Francisco King was advised to return sooner than planned today if new concerns arise.       Betty G. [place], MD  [facility]. [place] office.

## 2017-08-06 ENCOUNTER — Encounter: Payer: Self-pay | Admitting: Family Medicine

## 2017-08-06 ENCOUNTER — Ambulatory Visit (INDEPENDENT_AMBULATORY_CARE_PROVIDER_SITE_OTHER): Payer: BLUE CROSS/BLUE SHIELD | Admitting: Family Medicine

## 2017-08-06 VITALS — BP 132/80 | HR 60 | Resp 12 | Ht 69.0 in | Wt 233.5 lb

## 2017-08-06 DIAGNOSIS — E669 Obesity, unspecified: Secondary | ICD-10-CM | POA: Diagnosis not present

## 2017-08-06 DIAGNOSIS — Z6837 Body mass index (BMI) 37.0-37.9, adult: Secondary | ICD-10-CM

## 2017-08-06 DIAGNOSIS — I10 Essential (primary) hypertension: Secondary | ICD-10-CM

## 2017-08-06 DIAGNOSIS — Z23 Encounter for immunization: Secondary | ICD-10-CM | POA: Diagnosis not present

## 2017-08-06 DIAGNOSIS — E782 Mixed hyperlipidemia: Secondary | ICD-10-CM

## 2017-08-06 DIAGNOSIS — S99921A Unspecified injury of right foot, initial encounter: Secondary | ICD-10-CM | POA: Diagnosis not present

## 2017-08-06 LAB — LIPID PANEL
CHOL/HDL RATIO: 5
Cholesterol: 202 mg/dL — ABNORMAL HIGH (ref 0–200)
HDL: 44.9 mg/dL (ref >39.00)
LDL Cholesterol: 134 mg/dL — ABNORMAL HIGH (ref 0–99)
NonHDL: 157.33
TRIGLYCERIDES: 117 mg/dL (ref 0.0–149.0)
VLDL: 23.4 mg/dL (ref 0.0–40.0)

## 2017-08-06 NOTE — Patient Instructions (Addendum)
A few things to remember from today's visit:   Essential hypertension, benign  Class 2 obesity without serious comorbidity with body mass index (BMI) of 37.0 to 37.9 in adult, unspecified obesity type  Mixed hyperlipidemia - Plan: Lipid panel  Injury of toe on right foot, initial encounter   Fat and Cholesterol Restricted Diet Getting too much fat and cholesterol in your diet may cause health problems. Following this diet helps keep your fat and cholesterol at normal levels. This can keep you from getting sick. What types of fat should I choose?  Choose monosaturated and polyunsaturated fats. These are found in foods such as olive oil, canola oil, flaxseeds, walnuts, almonds, and seeds.  Eat more omega-3 fats. Good choices include salmon, mackerel, sardines, tuna, flaxseed oil, and ground flaxseeds.  Limit saturated fats. These are in animal products such as meats, butter, and cream. They can also be in plant products such as palm oil, palm kernel oil, and coconut oil.  Avoid foods with partially hydrogenated oils in them. These contain trans fats. Examples of foods that have trans fats are stick margarine, some tub margarines, cookies, crackers, and other baked goods. What general guidelines do I need to follow?  Check food labels. Look for the words "trans fat" and "saturated fat."  When preparing a meal: ? Fill half of your plate with vegetables and green salads. ? Fill one fourth of your plate with whole grains. Look for the word "whole" as the first word in the ingredient list. ? Fill one fourth of your plate with lean protein foods.  Eat more foods that have fiber, like apples, carrots, beans, peas, and barley.  Eat more home-cooked foods. Eat less at restaurants and buffets.  Limit or avoid alcohol.  Limit foods high in starch and sugar.  Limit fried foods.  Cook foods without frying them. Baking, boiling, grilling, and broiling are all great options.  Lose weight  if you are overweight. Losing even a small amount of weight can help your overall health. It can also help prevent diseases such as diabetes and heart disease. What foods can I eat? Grains Whole grains, such as whole wheat or whole grain breads, crackers, cereals, and pasta. Unsweetened oatmeal, bulgur, barley, quinoa, or brown rice. Corn or whole wheat flour tortillas. Vegetables Fresh or frozen vegetables (raw, steamed, roasted, or grilled). Green salads. Fruits All fresh, canned (in natural juice), or frozen fruits. Meat and Other Protein Products Ground beef (85% or leaner), grass-fed beef, or beef trimmed of fat. Skinless chicken or [place]. Ground chicken or [place]. Pork trimmed of fat. All fish and seafood. Eggs. Dried beans, peas, or lentils. Unsalted nuts or seeds. Unsalted canned or dry beans. Dairy Low-fat dairy products, such as skim or 1% milk, 2% or reduced-fat cheeses, low-fat ricotta or cottage cheese, or plain low-fat yogurt. Fats and Oils Tub margarines without trans fats. Light or reduced-fat mayonnaise and salad dressings. Avocado. Olive, canola, sesame, or safflower oils. Natural peanut or almond butter (choose ones without added sugar and oil). The items listed above may not be a complete list of recommended foods or beverages. Contact your dietitian for more options. What foods are not recommended? Grains White bread. White pasta. White rice. Cornbread. Bagels, pastries, and croissants. Crackers that contain trans fat. Vegetables White potatoes. Corn. Creamed or fried vegetables. Vegetables in a cheese sauce. Fruits Dried fruits. Canned fruit in light or heavy syrup. Fruit juice. Meat and Other Protein Products Fatty cuts of meat. Ribs, chicken wings,  bacon, sausage, bologna, salami, chitterlings, fatback, hot dogs, bratwurst, and packaged luncheon meats. Liver and organ meats. Dairy Whole or 2% milk, cream, half-and-half, and cream cheese. Whole milk cheeses. Whole-fat  or sweetened yogurt. Full-fat cheeses. Nondairy creamers and whipped toppings. Processed cheese, cheese spreads, or cheese curds. Sweets and Desserts Corn syrup, sugars, honey, and molasses. Candy. Jam and jelly. Syrup. Sweetened cereals. Cookies, pies, cakes, donuts, muffins, and ice cream. Fats and Oils Butter, stick margarine, lard, shortening, ghee, or bacon fat. Coconut, palm kernel, or palm oils. Beverages Alcohol. Sweetened drinks (such as sodas, lemonade, and fruit drinks or punches). The items listed above may not be a complete list of foods and beverages to avoid. Contact your dietitian for more information. This information is not intended to replace advice given to you by your health care provider. Make sure you discuss any questions you have with your health care provider. Document Released: 04/23/2012 Document Revised: 06/29/2016 Document Reviewed: 01/22/2014 Elsevier Interactive Patient Education  [employer].  Please be sure medication list is accurate. If a new problem present, please set up appointment sooner than planned today.

## 2017-12-21 ENCOUNTER — Ambulatory Visit: Payer: BLUE CROSS/BLUE SHIELD | Admitting: Family Medicine

## 2017-12-21 ENCOUNTER — Encounter: Payer: Self-pay | Admitting: Family Medicine

## 2017-12-21 VITALS — BP 126/78 | HR 66 | Temp 98.9°F | Resp 12 | Wt 243.2 lb

## 2017-12-21 DIAGNOSIS — R0981 Nasal congestion: Secondary | ICD-10-CM | POA: Diagnosis not present

## 2017-12-21 DIAGNOSIS — J069 Acute upper respiratory infection, unspecified: Secondary | ICD-10-CM

## 2017-12-21 MED ORDER — IPRATROPIUM BROMIDE 0.06 % NA SOLN: 2.0000 | Freq: Four times a day (QID) | NASAL | 12 refills | Status: DC

## 2017-12-21 NOTE — Progress Notes (Signed)
ACUTE VISIT  HPI:  Chief Complaint  Patient presents with  . Nasal Congestion    3 or 4 weeks now    Francisco King is a 42 y.o.male here today complaining of nasal congestion,rhinorrhea, and post nasal drainage. Symptoms started on 11/19/17, he also had body aches,chills,and sore throat;all these have resolved.   He is concerned about possible pneumonia. "A little bit" of cough,non productive. He denies dyspnea or wheezing.  He denies fever,chills,body aches,or fatigue.   URI   This is a new problem. The current episode started 1 to 4 weeks ago. The problem has been gradually improving. There has been no fever. Associated symptoms include congestion, coughing and rhinorrhea. Pertinent negatives include no abdominal pain, diarrhea, ear pain, headaches, nausea, neck pain, plugged ear sensation, rash, sinus pain, sneezing, sore throat, swollen glands, vomiting or wheezing. He has tried nothing for the symptoms.     No Hx of recent travel. + Sick contact, several people at school was sick at the same time and everybody recovered.. No known insect bite.  Hx of allergies: Yes,Hx of allergic rhinitis. He is currently on Flonase nasal spray, which helps with symptoms.  + Smoker.  Review of Systems  Constitutional: Negative for activity change, appetite change, chills, fatigue and fever.  HENT: Positive for congestion, postnasal drip and rhinorrhea. Negative for ear pain, mouth sores, sinus pain, sneezing, sore throat, trouble swallowing and voice change.   Eyes: Negative for discharge and redness.  Respiratory: Positive for cough. Negative for chest tightness, shortness of breath and wheezing.   Gastrointestinal: Negative for abdominal pain, diarrhea, nausea and vomiting.  Musculoskeletal: Negative for gait problem, myalgias and neck pain.  Skin: Negative for rash.  Allergic/Immunologic: Positive for environmental allergies.  Neurological: Negative for weakness  and headaches.  Hematological: Negative for adenopathy. Does not bruise/bleed easily.      Current Outpatient Medications on File Prior to Visit  Medication Sig Dispense Refill  . fluticasone (FLONASE) 50 MCG/ACT nasal spray PLACE 1 SPRAY INTO BOTH NOSTRILS 2 TIMES DAILY. 16 g 3   No current facility-administered medications on file prior to visit.      Past Medical History:  Diagnosis Date  . Hyperlipidemia   . Hypertension    No Known Allergies  Social History   Socioeconomic History  . Marital status: Single    Spouse name: None  . Number of children: None  . Years of education: None  . Highest education level: None  Social Needs  . Financial resource strain: None  . Food insecurity - worry: None  . Food insecurity - inability: None  . Transportation needs - medical: None  . Transportation needs - non-medical: None  Occupational History  . None  Tobacco Use  . Smoking status: Current Every Day Smoker    Packs/day: 0.50    Years: 15.00    Pack years: 7.50    Types: Cigarettes  . Smokeless tobacco: Never Used  Substance and Sexual Activity  . Alcohol use: No  . Drug use: No  . Sexual activity: None  Other Topics Concern  . None  Social History Narrative  . None    Vitals:   12/21/17 1522  BP: 126/78  Pulse: 66  Resp: 12  Temp: 98.9 F (37.2 C)  SpO2: 98%   Body mass index is 35.92 kg/m.    Physical Exam  Nursing note and vitals reviewed. Constitutional: He is oriented to person, place, and time. He  appears well-developed. He does not appear ill. No distress.  HENT:  Head: Normocephalic and atraumatic.  Right Ear: Tympanic membrane, external ear and ear canal normal.  Left Ear: Tympanic membrane, external ear and ear canal normal.  Nose: Rhinorrhea present. Right sinus exhibits no maxillary sinus tenderness and no frontal sinus tenderness. Left sinus exhibits no maxillary sinus tenderness and no frontal sinus tenderness.  Mouth/Throat:  Oropharynx is clear and moist and mucous membranes are normal.  Nasal voice. Hypertrophic turbinates.  Eyes: Conjunctivae are normal.  Cardiovascular: Normal rate and regular rhythm.  No murmur heard. Respiratory: Effort normal and breath sounds normal. No respiratory distress.  Lymphadenopathy:    He has no cervical adenopathy.  Neurological: He is alert and oriented to person, place, and time. He has normal strength.  Skin: Skin is warm. No rash noted. No erythema.  Psychiatric: He has a normal mood and affect.  Well groomed, good eye contact.     ASSESSMENT AND PLAN:   Francisco King was seen today for nasal congestion.  Diagnoses and all orders for this visit:  Viral upper respiratory tract infection  Most likely viral and resolved. Explained that nasal congestion and rhinorrhea as well as cough could last a few days and even weeks. Today auscultation is negative,I do not think imaging is needed. Instructed about warning signs.  Nasal sinus congestion  Residual from recent URI and could be aggravated by his Hx of allergic rhinitis. Continue Flonase nasal spray. Nasal irrigations with saline and steam inhalation may also help. Atrovent nasal spray may also help. F/U as needed.  -     ipratropium (ATROVENT) 0.06 % nasal spray; Place 2 sprays into both nostrils 4 (four) times daily.      -Mr. Hillary Schwegler was advised to seek attention immediately if symptoms worsen or to follow if they persist or new concerns arise.       Larayah Clute G. [place], MD  [facility]. [place] office.

## 2017-12-21 NOTE — Patient Instructions (Addendum)
A few things to remember from today's visit:   Viral upper respiratory tract infection  Nasal sinus congestion - Plan: ipratropium (ATROVENT) 0.06 % nasal spray   Over the counter medications as decongestants and cold medications usually help, they need to be taken with caution if there is a history of high blood pressure or palpitations.  Steam inhalations helps with runny nose, nasal congestion, and may prevent sinus infections. Cough and nasal congestion could last a few days and sometimes weeks. Please follow in not any better in 1-2 weeks or if symptoms get worse.  Please be sure medication list is accurate. If a new problem present, please set up appointment sooner than planned today.

## 2018-02-06 ENCOUNTER — Encounter: Payer: BLUE CROSS/BLUE SHIELD | Admitting: Family Medicine

## 2018-03-10 IMAGING — DX DG CHEST 2V
2 series · 2 of 2 positions shown · non-contrast
Comparison: None.

CLINICAL DATA: Cough and congestion

EXAM:
CHEST  2 VIEW

[chest pa]
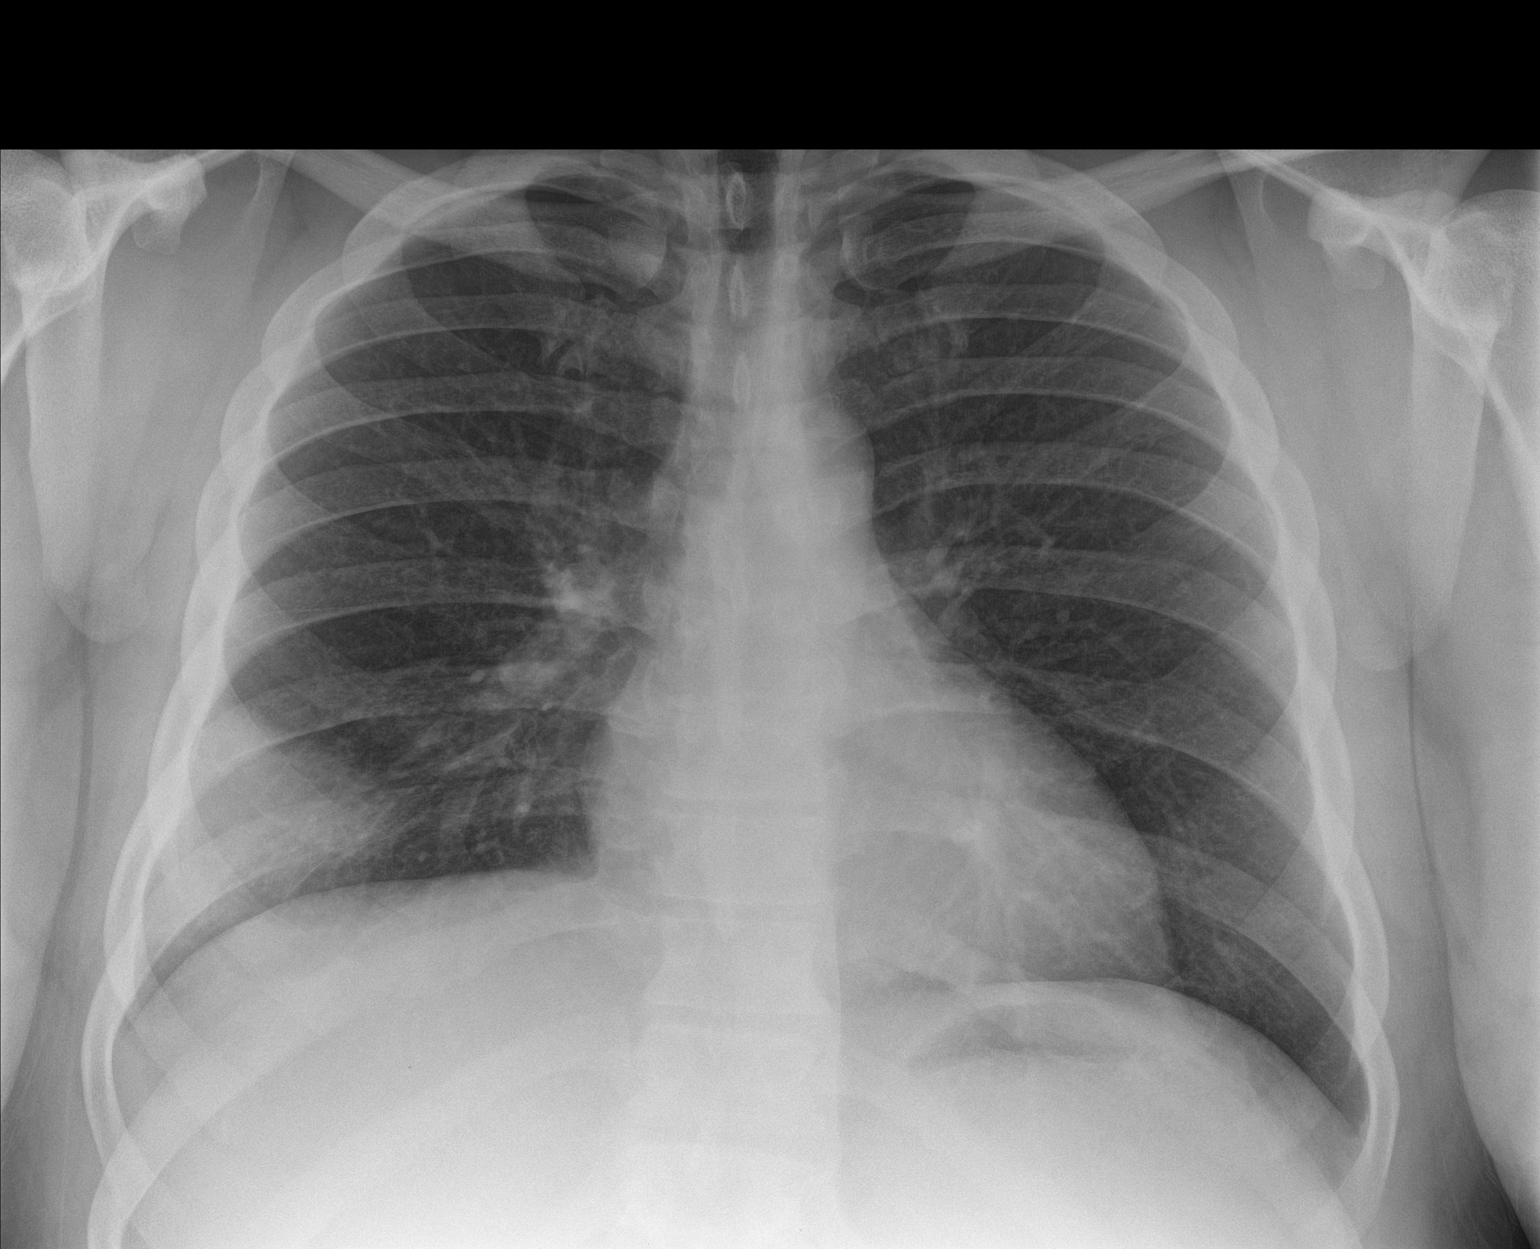

[chest lat]
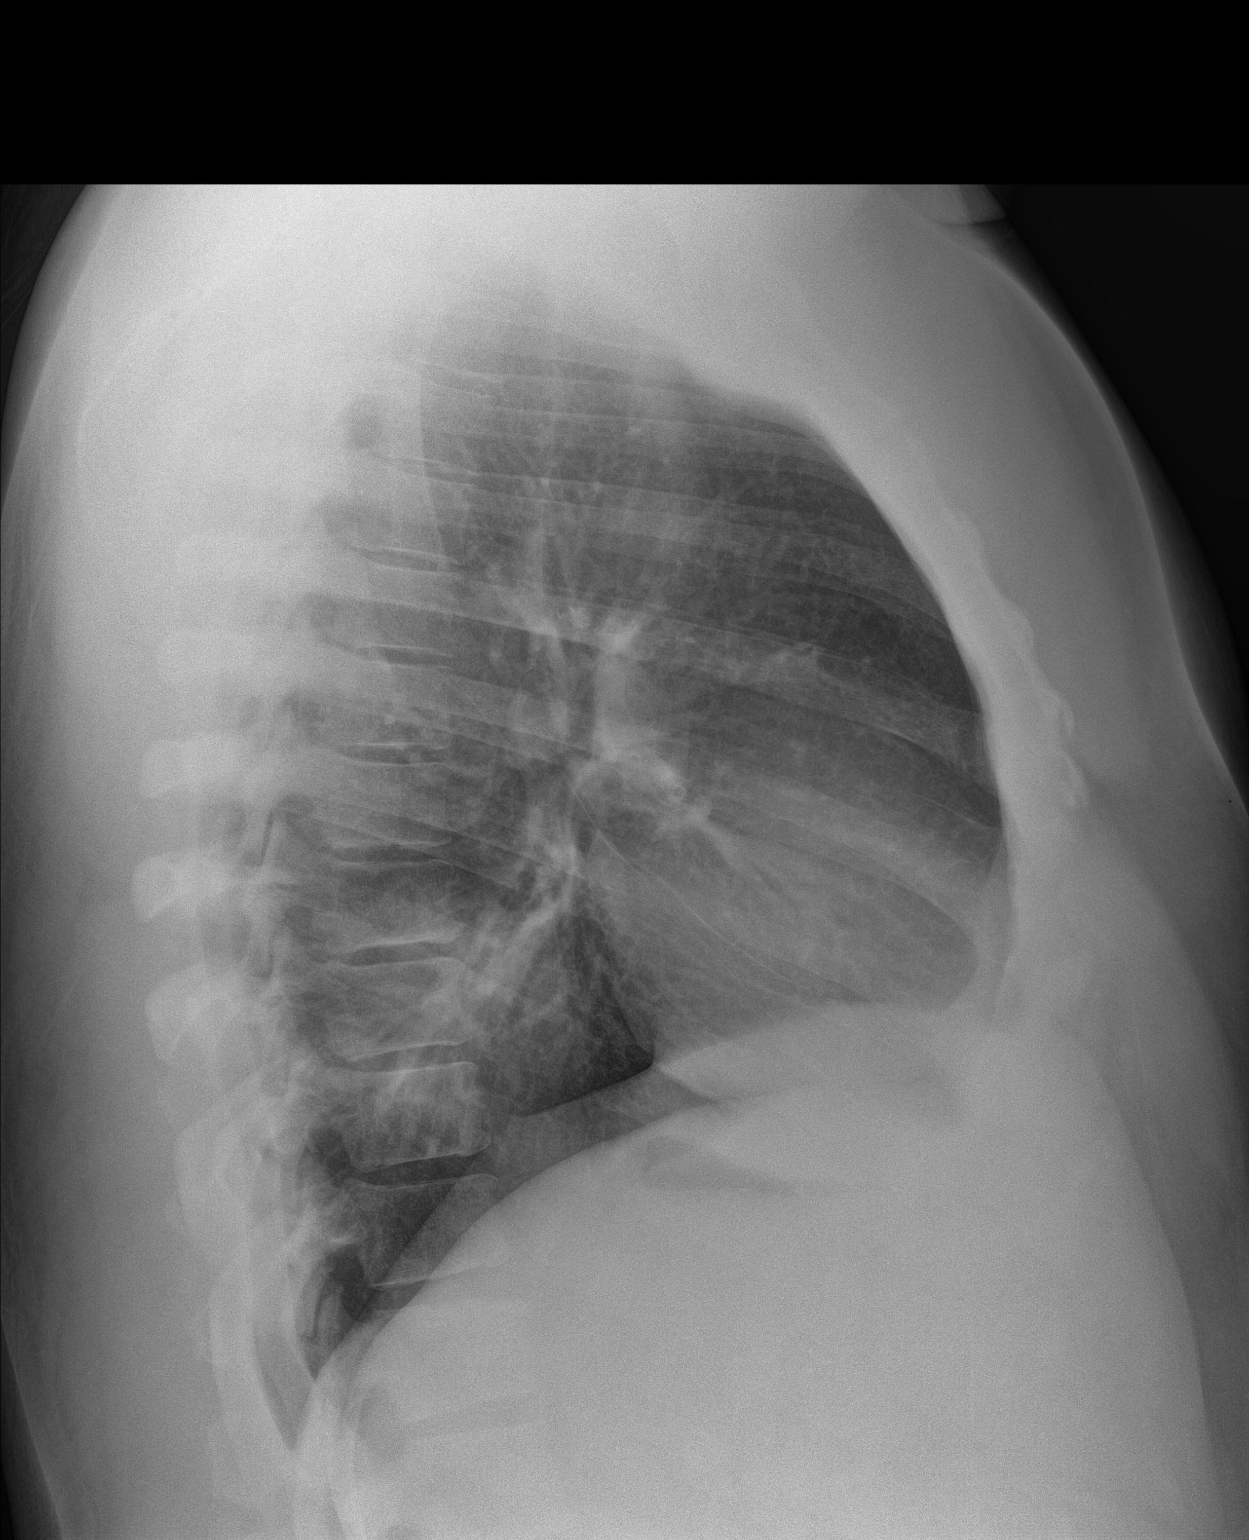

[2 of 2 positions shown; findings below may reference images not displayed]

FINDINGS: Cardiomediastinal silhouette is unremarkable. There is infiltrate/
pneumonia in right lower lobe posterior lateral segment. Follow-up
to resolution after appropriate treatment is recommended. Left lung
is clear. No pulmonary edema.
IMPRESSION: Infiltrate/pneumonia in left lower lobe posterior lateral segment.
Follow-up to resolution after appropriate treatment is recommended.

## 2019-01-27 ENCOUNTER — Encounter: Payer: BLUE CROSS/BLUE SHIELD | Admitting: Family Medicine

## 2019-06-20 ENCOUNTER — Other Ambulatory Visit: Payer: Self-pay

## 2019-06-20 ENCOUNTER — Telehealth (INDEPENDENT_AMBULATORY_CARE_PROVIDER_SITE_OTHER): Payer: BLUE CROSS/BLUE SHIELD | Admitting: Family Medicine

## 2019-06-20 VITALS — BP 140/90 | HR 79 | Temp 98.3°F | Resp 12 | Ht 69.0 in | Wt 246.1 lb

## 2019-06-20 DIAGNOSIS — K0889 Other specified disorders of teeth and supporting structures: Secondary | ICD-10-CM | POA: Diagnosis not present

## 2019-06-20 DIAGNOSIS — R238 Other skin changes: Secondary | ICD-10-CM | POA: Diagnosis not present

## 2019-06-20 DIAGNOSIS — K047 Periapical abscess without sinus: Secondary | ICD-10-CM

## 2019-06-20 MED ORDER — PREDNISONE 20 MG PO TABS: ORAL_TABLET | ORAL | 0 refills | Status: AC

## 2019-06-20 MED ORDER — AMOXICILLIN 875 MG PO TABS: 875.0000 mg | ORAL_TABLET | Freq: Two times a day (BID) | ORAL | 0 refills | Status: AC

## 2019-06-20 MED ORDER — TRAMADOL HCL 50 MG PO TABS: 50.0000 mg | ORAL_TABLET | Freq: Three times a day (TID) | ORAL | 0 refills | Status: AC | PRN

## 2019-06-20 NOTE — Progress Notes (Signed)
ACUTE VISIT   HPI:    [name] is a 43 y.o. male, who is here today complaining of pruritic skin rash that started about 3 weeks ago. Initially it was on hands,in between fingers. He attributes rash to possible poison ivy a few days before rash started. Vesicular lesions on volar aspect of wrist,slome on forearm, and abdomen. Pruritis is worse at night and when exposed to heat.  He denies any new medication, detergent, soap, or body product. Has been taking Ibuprofen and working outdoors during hot days. No known insect bite. No sick contact. No Hx of eczema or similar rash in the past.  OTC medication for this problem: Benadryl and topical calamine.  He  denies oral lesions/edema,cough, wheezing, dyspnea, abdominal pain, nausea, or vomiting.   2-3 weeks of tooth pain,edema. No Hx of trauma. Pain is exacerbated by chewing.  Taking Ibuprofen. Interfering with sleep. He has an appt with dentist in 2-3 weeks.  He is requesting something for pain.   Review of Systems  Constitutional: Negative for appetite change and fatigue.  HENT: Negative for mouth sores, sinus pain and sore throat.   Eyes: Negative for redness and visual disturbance.  Respiratory: Negative for cough, shortness of breath and wheezing.   Genitourinary: Negative for decreased urine volume and hematuria.  Musculoskeletal: Negative for joint swelling and myalgias.  Skin: Negative for wound.  Allergic/Immunologic: Negative for environmental allergies.  Neurological: Negative for headaches.  Hematological: Negative for adenopathy. Does not bruise/bleed easily.  Rest see pertinent positives and negatives per HPI.   No current outpatient medications on file prior to visit.   No current facility-administered medications on file prior to visit.     Past Medical History:  Diagnosis Date  . Hyperlipidemia   . Hypertension    No Known Allergies  Social History   Socioeconomic  History  . Marital status: Single    Spouse name: Not on file  . Number of children: Not on file  . Years of education: Not on file  . Highest education level: Not on file  Occupational History  . Not on file  Social Needs  . Financial resource strain: Not on file  . Food insecurity    Worry: Not on file    Inability: Not on file  . Transportation needs    Medical: Not on file    Non-medical: Not on file  Tobacco Use  . Smoking status: Current Every Day Smoker    Packs/day: 0.50    Years: 15.00    Pack years: 7.50    Types: Cigarettes  . Smokeless tobacco: Never Used  Substance and Sexual Activity  . Alcohol use: No  . Drug use: No  . Sexual activity: Not on file  Lifestyle  . Physical activity    Days per week: Not on file    Minutes per session: Not on file  . Stress: Not on file  Relationships  . Social Musicianconnections    Talks on phone: Not on file    Gets together: Not on file    Attends religious service: Not on file    Active member of club or organization: Not on file    Attends meetings of clubs or organizations: Not on file    Relationship status: Not on file  Other Topics Concern  . Not on file  Social History Narrative  . Not on file    Vitals:   06/20/19 0949  BP: 140/90  Pulse:  79  Resp: 12  Temp: 98.3 F (36.8 C)  SpO2: 99%   Body mass index is 36.35 kg/m.   Physical Exam  Nursing note and vitals reviewed. Constitutional: He is oriented to person, place, and time. He appears well-developed. No distress.  HENT:  Head: Normocephalic and atraumatic.  Mouth/Throat: Uvula is midline, oropharynx is clear and moist and mucous membranes are normal.    Erythema and mild edema around right lower last molar. I do not appreciate drainage.  Eyes: Conjunctivae are normal.  Cardiovascular: Normal rate and regular rhythm.  No murmur heard. Respiratory: Effort normal and breath sounds normal. No respiratory distress.  Musculoskeletal:        General:  No edema.  Lymphadenopathy:       Head (right side): No submandibular adenopathy present.       Head (left side): No submandibular adenopathy present.    He has no cervical adenopathy.  Neurological: He is alert and oriented to person, place, and time. Gait normal.  Skin: Skin is warm. Rash noted. Rash is papular and vesicular. There is erythema.     Vesicular confluent rash on wrist and right buttock. One 4 mm vesiculae on forearm. Also some lesions in between fingers.  See pictures. Papular erythematous rash scatted on abdomen and chest.   Psychiatric: He has a normal mood and affect.  Well groomed, good eye contact.          ASSESSMENT AND PLAN:  [name] is a 43 years old seen today because pruritic rash  Diagnoses and all orders for this visit:  Vesicular rash Most lesions are confluent vesicular + other scattered papular, I am not sure if this is due to contact dermatitis. After discussing side effects he agrees with taking Prednisone taper. ? Photosensitive, recommend stopping Ibuprofen.  Instructed about warning signs.  -     predniSONE (DELTASONE) 20 MG tablet; 3 tabs for 3 days, 2 tabs for 3 days, 1 tabs for 3 days, and 1/2 tab for 3 days. Take tables together with breakfast.  Toothache Avoid Ibuprofen. Tramadol side effects discussed, recommend taking it at bedtime. Keep appt with dentist.  -     traMADol (ULTRAM) 50 MG tablet; Take 1 tablet (50 mg total) by mouth every 8 (eight) hours as needed for up to 5 days.  Dental abscess Abx treatments started. Instructed about warning  Signs. Keep dental appt.  -     amoxicillin (AMOXIL) 875 MG tablet; Take 1 tablet (875 mg total) by mouth 2 (two) times daily for 10 days.  Visit was initially arranged as Doxy but he came to the office.   Follow up as needed.   -[name] was advised to seek immediate medical attention if sudden worsening symptoms or to follow if they persist or if new  concerns arise.       Mickel Schreur G. [place], MD  [facility]. [place] office.

## 2019-06-20 NOTE — Patient Instructions (Addendum)
A few things to remember from today's visit:   Vesicular rash - Plan: predniSONE (DELTASONE) 20 MG tablet  Toothache - Plan: traMADol (ULTRAM) 50 MG tablet  Dental abscess - Plan: amoxicillin (AMOXIL) 875 MG tablet Her menstrual rash is related with poison ivy. ?  Photosensitivity. Avoid sun exposure.  Take Tylenol at bedtime along with Tylenol. Avoid ibuprofen or similar medication.  Keep your next dental appointment.  Please be sure medication list is accurate. If a new problem present, please set up appointment sooner than planned today.

## 2019-07-01 ENCOUNTER — Ambulatory Visit (INDEPENDENT_AMBULATORY_CARE_PROVIDER_SITE_OTHER): Payer: BLUE CROSS/BLUE SHIELD | Admitting: Family Medicine

## 2019-07-01 ENCOUNTER — Other Ambulatory Visit: Payer: Self-pay

## 2019-07-01 ENCOUNTER — Encounter: Payer: Self-pay | Admitting: Family Medicine

## 2019-07-01 VITALS — BP 130/88 | HR 63 | Temp 97.8°F | Resp 12 | Ht 69.0 in | Wt 246.4 lb

## 2019-07-01 DIAGNOSIS — Z0000 Encounter for general adult medical examination without abnormal findings: Secondary | ICD-10-CM

## 2019-07-01 DIAGNOSIS — Z1329 Encounter for screening for other suspected endocrine disorder: Secondary | ICD-10-CM

## 2019-07-01 DIAGNOSIS — Z13228 Encounter for screening for other metabolic disorders: Secondary | ICD-10-CM | POA: Diagnosis not present

## 2019-07-01 DIAGNOSIS — Z130 Encounter for screening for diseases of the blood and blood-forming organs and certain disorders involving the immune mechanism: Secondary | ICD-10-CM | POA: Diagnosis not present

## 2019-07-01 DIAGNOSIS — I10 Essential (primary) hypertension: Secondary | ICD-10-CM | POA: Diagnosis not present

## 2019-07-01 DIAGNOSIS — E782 Mixed hyperlipidemia: Secondary | ICD-10-CM

## 2019-07-01 LAB — LIPID PANEL
Cholesterol: 229 mg/dL — ABNORMAL HIGH (ref 0–200)
HDL: 45.4 mg/dL (ref >39.00)
NonHDL: 183.5
Total CHOL/HDL Ratio: 5
Triglycerides: 237 mg/dL — ABNORMAL HIGH (ref 0.0–149.0)
VLDL: 47.4 mg/dL — ABNORMAL HIGH (ref 0.0–40.0)

## 2019-07-01 LAB — BASIC METABOLIC PANEL
BUN: 16 mg/dL (ref 6–23)
CO2: 24 mEq/L (ref 19–32)
Calcium: 9.2 mg/dL (ref 8.4–10.5)
Chloride: 107 mEq/L (ref 96–112)
Creatinine, Ser: 0.99 mg/dL (ref 0.40–1.50)
GFR: 100.62 mL/min (ref >60.00)
Glucose, Bld: 102 mg/dL — ABNORMAL HIGH (ref 70–99)
Potassium: 3.9 mEq/L (ref 3.5–5.1)
Sodium: 139 mEq/L (ref 135–145)

## 2019-07-01 LAB — HEMOGLOBIN A1C: Hgb A1c MFr Bld: 5.4 % (ref 4.6–6.5)

## 2019-07-01 LAB — LDL CHOLESTEROL, DIRECT: Direct LDL: 130 mg/dL

## 2019-07-01 NOTE — Progress Notes (Signed)
HPI:  [name] is a 43 y.o.male here today for his routine physical examination.  Last CPE: 02/2017. He lives with his girlfriend.  No new problems since his last visit.  Regular exercise 3 or more times per week: Not as consistent as he did when gyms were open and none for the past 3 weeks. Following a healthy diet: Not consistently.  Chronic medical problems: HTN,tobacco use,and HLD among some.  Hypertension, currently he is on nonpharmacologic treatment. He is not checking BP regularly. He has not been consistent with low-salt diet.  Lab Results  Component Value Date   CREATININE 1.06 01/29/2017   BUN 17 01/29/2017   NA 139 01/29/2017   K 4.2 01/29/2017   CL 108 01/29/2017   CO2 25 01/29/2017   HLD on nonpharmacologic treatment. Lately he has been attending parties on "barbecues", so he had not been consistent with following low-fat diet. He has refused pharmacologic treatment in the past.  Lab Results  Component Value Date   CHOL 202 (H) 08/06/2017   HDL 44.90 08/06/2017   LDLCALC 134 (H) 08/06/2017   LDLDIRECT 120.0 01/29/2017   TRIG 117.0 08/06/2017   CHOLHDL 5 08/06/2017    Hx of STD's: Treated for chlamydia years ago.  Immunization History  Administered Date(s) Administered  . Influenza,inj,Quad PF,6+ Mos 08/07/2016, 08/06/2017  . Tdap 02/05/2017   -Denies high alcohol intake or Hx of illicit drug use. + Smoker, he has not tried to quit.  No concerns today.  Review of Systems  Constitutional: Negative for activity change, appetite change, fatigue and fever.  HENT: Negative for dental problem, nosebleeds, sore throat and trouble swallowing.   Eyes: Negative for redness and visual disturbance.  Respiratory: Negative for cough, shortness of breath and wheezing.   Cardiovascular: Negative for chest pain, palpitations and leg swelling.  Gastrointestinal: Negative for abdominal pain, blood in stool, nausea and vomiting.  Endocrine:  Negative for cold intolerance, heat intolerance, polydipsia, polyphagia and polyuria.  Genitourinary: Negative for decreased urine volume, discharge, dysuria, genital sores, hematuria and testicular pain.  Musculoskeletal: Negative for gait problem and myalgias.  Skin: Negative for color change and rash.  Allergic/Immunologic: Negative for environmental allergies.  Neurological: Negative for syncope, weakness and headaches.  Hematological: Negative for adenopathy. Does not bruise/bleed easily.  Psychiatric/Behavioral: Negative for confusion. The patient is not nervous/anxious.   All other systems reviewed and are negative.  No current outpatient medications on file prior to visit.   No current facility-administered medications on file prior to visit.    Past Medical History:  Diagnosis Date  . Hyperlipidemia   . Hypertension    Past Surgical History:  Procedure Laterality Date  . EYE SURGERY     No Known Allergies  Family History  Problem Relation Age of Onset  . Cancer Father        lung  . Cancer Mother        breast  . Hypertension Mother   . Cancer Sister        breast  . Diabetes Maternal Grandmother   . Hypertension Maternal Grandmother    Social History   Socioeconomic History  . Marital status: Single    Spouse name: Not on file  . Number of children: Not on file  . Years of education: Not on file  . Highest education level: Not on file  Occupational History  . Not on file  Social Needs  . Financial resource strain: Not on file  . Food  insecurity    Worry: Not on file    Inability: Not on file  . Transportation needs    Medical: Not on file    Non-medical: Not on file  Tobacco Use  . Smoking status: Current Every Day Smoker    Packs/day: 0.50    Years: 15.00    Pack years: 7.50    Types: Cigarettes  . Smokeless tobacco: Never Used  Substance and Sexual Activity  . Alcohol use: No  . Drug use: No  . Sexual activity: Not on file  Lifestyle  .  Physical activity    Days per week: Not on file    Minutes per session: Not on file  . Stress: Not on file  Relationships  . Social Herbalist on phone: Not on file    Gets together: Not on file    Attends religious service: Not on file    Active member of club or organization: Not on file    Attends meetings of clubs or organizations: Not on file    Relationship status: Not on file  Other Topics Concern  . Not on file  Social History Narrative  . Not on file    Today's Vitals   07/01/19 1026  BP: 130/88  Pulse: 63  Resp: 12  Temp: 97.8 F (36.6 C)  TempSrc: Temporal  SpO2: 98%  Weight: 246 lb 6 oz (111.8 kg)  Height: 5\' 9"  (1.753 m)   Body mass index is 36.38 kg/m.  Wt Readings from Last 3 Encounters:  07/01/19 246 lb 6 oz (111.8 kg)  06/20/19 246 lb 2 oz (111.6 kg)  12/21/17 243 lb 4 oz (110.3 kg)   Physical Exam  Nursing note and vitals reviewed. Constitutional: He is oriented to person, place, and time. He appears well-developed. No distress.  HENT:  Head: Normocephalic and atraumatic.  Right Ear: Tympanic membrane, external ear and ear canal normal.  Left Ear: Tympanic membrane, external ear and ear canal normal.  Mouth/Throat: Oropharynx is clear and moist and mucous membranes are normal.  Eyes: Pupils are equal, round, and reactive to light. Conjunctivae and EOM are normal.  Neck: Normal range of motion. No tracheal deviation present. No thyromegaly present.  Cardiovascular: Normal rate and regular rhythm.  No murmur heard. Pulses:      Dorsalis pedis pulses are 2+ on the right side and 2+ on the left side.  Respiratory: Effort normal and breath sounds normal. No respiratory distress.  GI: Soft. He exhibits no mass. There is no hepatomegaly. There is no abdominal tenderness.  Genitourinary:    Genitourinary Comments: Refused,no concerns.  Musculoskeletal:        General: No tenderness or edema.     Comments: No major deformities appreciated  and no signs of synovitis.  Lymphadenopathy:    He has no cervical adenopathy.       Right: No supraclavicular adenopathy present.       Left: No supraclavicular adenopathy present.  Neurological: He is alert and oriented to person, place, and time. He has normal strength. No cranial nerve deficit or sensory deficit. Coordination and gait normal.  Reflex Scores:      Bicep reflexes are 2+ on the right side and 2+ on the left side.      Patellar reflexes are 2+ on the right side and 2+ on the left side. Skin: Skin is warm. No erythema.  Psychiatric: He has a normal mood and affect. Cognition and memory are normal.  Well groomed,good eye contact.   [name] was here today annual physical examination.  Orders Placed This Encounter  Procedures  . Lipid panel  . Basic metabolic panel  . Hemoglobin A1c  . LDL cholesterol, direct   Lab Results  Component Value Date   CREATININE 0.99 07/01/2019   BUN 16 07/01/2019   NA 139 07/01/2019   K 3.9 07/01/2019   CL 107 07/01/2019   CO2 24 07/01/2019    Lab Results  Component Value Date   HGBA1C 5.4 07/01/2019   Lab Results  Component Value Date   CHOL 229 (H) 07/01/2019   HDL 45.40 07/01/2019   LDLCALC 134 (H) 08/06/2017   LDLDIRECT 130.0 07/01/2019   TRIG 237.0 (H) 07/01/2019   CHOLHDL 5 07/01/2019    Routine general medical examination at a health care facility We discussed the importance of regular physical activity and healthy diet for prevention of chronic illness and/or complications. Preventive guidelines reviewed. Vaccination up-to-date.  Next CPE in a year.  The 10-year ASCVD risk score Denman George DC Montez Hageman., et al., 2013) is: 6.2%   Values used to calculate the score:     Age: 43 years     Sex: Male     Is Non-Hispanic African American: Yes     Diabetic: No     Tobacco smoker: Yes     Systolic Blood Pressure: 130 mmHg     Is BP treated: No     HDL Cholesterol: 45.4 mg/dL     Total Cholesterol: 229  mg/dL  Essential hypertension, benign Otherwise BP is adequately controlled, although I would like BP readings<= 130/80. Instructed to monitor BP regularly. Possible complications of elevated BP discussed.  Mixed hyperlipidemia For now he would like to continue on nonpharmacologic treatment. Further recommendation will be given according to 10-year CVD score and lipid panel results.  -     Lipid panel  Screening for endocrine, metabolic and immunity disorder -     Basic metabolic panel -     Hemoglobin A1c  Return in about 1 year (around 06/30/2020) for cpe and f/u.   Betty G. [place], MD  [facility]. [place] office.

## 2019-07-01 NOTE — Patient Instructions (Signed)
A few things to remember from today's visit:   Essential hypertension, benign  Mixed hyperlipidemia - Plan: Lipid panel  Routine general medical examination at a health care facility  Screening for endocrine, metabolic and immunity disorder - Plan: Basic metabolic panel, Hemoglobin A1c   At least 150 minutes of moderate exercise per week, daily brisk walking for 15-30 min is a good exercise option. Healthy diet low in saturated (animal) fats and sweets and consisting of fresh fruits and vegetables, lean meats such as fish and white chicken and whole grains.  - Vaccines:  Tdap vaccine every 10 years.  Shingles vaccine recommended at age 86, could be given after 41 years of age but not sure about insurance coverage.  Pneumonia vaccines:  Prevnar 67 at 82 and Pneumovax at 66.   -Screening recommendations for low/normal risk males:  Screening for diabetes at age 2 and every 3 years. Earlier screening if cardiovascular risk factors.  Colon cancer screening at age 29 and until age 34.  Prostate cancer screening: some controversy, starts usually at 56: Rectal exam and PSA.  Aortic Abdominal Aneurism once between 68 and 43 years old if ever smoker.  Also recommended:  1. Dental visit- Brush and floss your teeth twice daily; visit your dentist twice a year. 2. Eye doctor- Get an eye exam at least every 2 years. 3. Helmet use- Always wear a helmet when riding a bicycle, motorcycle, rollerblading or skateboarding. 4. Safe sex- If you may be exposed to sexually transmitted infections, use a condom. 5. Seat belts- Seat belts can save your live; always wear one. 6. Smoke/Carbon Monoxide detectors- These detectors need to be installed on the appropriate level of your home. Replace batteries at least once a year. 7. Skin cancer- When out in the sun please cover up and use sunscreen 15 SPF or higher. 8. Violence- If anyone is threatening or hurting you, please tell your healthcare  provider.  9. Drink alcohol in moderation- Limit alcohol intake to one drink or less per day. Never drink and drive.

## 2019-07-04 ENCOUNTER — Other Ambulatory Visit: Payer: Self-pay | Admitting: *Deleted

## 2019-07-04 MED ORDER — ATORVASTATIN CALCIUM 10 MG PO TABS: 10.0000 mg | ORAL_TABLET | Freq: Every day | ORAL | 3 refills | Status: DC

## 2019-07-15 ENCOUNTER — Telehealth: Payer: Self-pay | Admitting: Family Medicine

## 2019-07-15 NOTE — Telephone Encounter (Signed)
If it is small area topical steroid may be more appropriate due to possible side effects of systemic steroids.  If lesions are scattered on a extensive area,Depo Medrol 40 mg IM can be given. Thanks, BJ

## 2019-07-15 NOTE — Telephone Encounter (Signed)
See note

## 2019-07-15 NOTE — Telephone Encounter (Signed)
Pt called to say that after he finished the medication for Poison oak or Ivy he started to see small bumps come back on his  arms and fingers as they were when it first started. Pt wants to know if Dr. Martinique wants him to come in a get a shot that they spoke of duirng the last appt before it gets worse/ please advise

## 2019-07-15 NOTE — Telephone Encounter (Signed)
Please advise 

## 2019-07-16 ENCOUNTER — Ambulatory Visit (INDEPENDENT_AMBULATORY_CARE_PROVIDER_SITE_OTHER): Payer: BLUE CROSS/BLUE SHIELD

## 2019-07-16 ENCOUNTER — Other Ambulatory Visit: Payer: Self-pay

## 2019-07-16 DIAGNOSIS — J309 Allergic rhinitis, unspecified: Secondary | ICD-10-CM | POA: Diagnosis not present

## 2019-07-16 MED ORDER — METHYLPREDNISOLONE ACETATE 40 MG/ML IJ SUSP
40.0000 mg | Freq: Once | INTRAMUSCULAR | Status: AC
  Administered 2019-07-16: 13:00:00 40 mg via INTRAMUSCULAR

## 2019-07-16 NOTE — Progress Notes (Signed)
Per orders of Dr. Martinique, injection of Depo Medrol given in Left deltoid by Franco Collet. Patient tolerated injection well.

## 2019-07-16 NOTE — Telephone Encounter (Signed)
Pt scheduled for NV to receive injection today, per Dr Doug Sou recommendation.

## 2019-07-16 NOTE — Patient Instructions (Signed)
Health Maintenance Due  Topic Date Due  . INFLUENZA VACCINE  06/07/2019    Depression screen PHQ 2/9 06/20/2019  Decreased Interest 0  Down, Depressed, Hopeless 0  PHQ - 2 Score 0

## 2019-08-08 ENCOUNTER — Ambulatory Visit (INDEPENDENT_AMBULATORY_CARE_PROVIDER_SITE_OTHER): Payer: BLUE CROSS/BLUE SHIELD | Admitting: Family Medicine

## 2019-08-08 ENCOUNTER — Other Ambulatory Visit: Payer: Self-pay

## 2019-08-08 ENCOUNTER — Telehealth: Payer: Self-pay | Admitting: Family Medicine

## 2019-08-08 ENCOUNTER — Encounter: Payer: Self-pay | Admitting: Family Medicine

## 2019-08-08 VITALS — BP 120/60 | HR 72 | Temp 98.1°F | Wt 248.3 lb

## 2019-08-08 DIAGNOSIS — L301 Dyshidrosis [pompholyx]: Secondary | ICD-10-CM

## 2019-08-08 MED ORDER — BETAMETHASONE DIPROPIONATE 0.05 % EX CREA
TOPICAL_CREAM | Freq: Two times a day (BID) | CUTANEOUS | 1 refills | Status: DC
Start: 2019-08-08 — End: 2020-01-21

## 2019-08-08 NOTE — Telephone Encounter (Signed)
Patient was seen today by Dr. Elease Hashimoto and would like to know if an RX for prednisone could be called into pharmacy. Patient states he is itchy all over and does not feel like the medication prescribed will help him right now.

## 2019-08-08 NOTE — Patient Instructions (Signed)
Try to keep hands out of water as much as possible.    May use the cream twice daily as needed.

## 2019-08-08 NOTE — Progress Notes (Signed)
  Subjective:     Patient ID: Francisco King, male   DOB: 01-27-1976, 43 y.o.   MRN: [id number]  HPI Patient is here with recurrent rash involving both hands.  He has been seen by a virtual visit back in early August and was treated with prednisone which helped slightly.  He states he does have his hands in water quite a bit.  His rash comes up as small vesicles mostly on the sides of the digits and then becomes more dry.  He has significant pruritus.  He is tried over-the-counter hydrocortisone cream without much improvement.  He is also tried occasional moisturizers without much improvement.  Denies any truncal rash.  Past Medical History:  Diagnosis Date  . Hyperlipidemia   . Hypertension    Past Surgical History:  Procedure Laterality Date  . EYE SURGERY      reports that he has been smoking cigarettes. He has a 7.50 pack-year smoking history. He has never used smokeless tobacco. He reports that he does not drink alcohol or use drugs. family history includes Cancer in his father, mother, and sister; Diabetes in his maternal grandmother; Hypertension in his maternal grandmother and mother. No Known Allergies   Review of Systems  Constitutional: Negative for chills and fever.  Skin: Positive for rash.       Objective:   Physical Exam Constitutional:      Appearance: Normal appearance.  Cardiovascular:     Rate and Rhythm: Normal rate and regular rhythm.  Pulmonary:     Effort: Pulmonary effort is normal.     Breath sounds: Normal breath sounds.  Skin:    Findings: Rash present.     Comments: He has some scattered small vesicles on the sides of a few different digits.  In a couple areas looks a more crusted.  Sparing of the palms and dorsal aspect of the hand  Neurological:     Mental Status: He is alert.        Assessment:     Probable dyshidrotic eczema    Plan:     -Keep hands out of water as much as possible -Beclomethasone dipropionate 0.05% cream twice  daily as needed no more than 2 weeks continuously -He is aware this may flare time to time -Follow-up with Dr. Martinique if not improving over the next couple of weeks  Eulas Post MD [facility]

## 2019-08-08 NOTE — Telephone Encounter (Signed)
Go ahead and send in Prednisone 10 mg taper: 4-4-4-3-3-2-2-1-1  (24).  Would still use the cream on main affected areas.

## 2019-08-11 MED ORDER — PREDNISONE 10 MG PO TABS: 10.0000 mg | ORAL_TABLET | Freq: Every day | ORAL | 0 refills | Status: DC

## 2019-08-11 NOTE — Telephone Encounter (Signed)
Rx sent in to pharmacy. 

## 2020-01-20 ENCOUNTER — Other Ambulatory Visit: Payer: Self-pay

## 2020-01-21 ENCOUNTER — Ambulatory Visit: Payer: 59 | Admitting: Family Medicine

## 2020-01-21 ENCOUNTER — Encounter: Payer: Self-pay | Admitting: Family Medicine

## 2020-01-21 VITALS — BP 136/70 | HR 83 | Resp 12 | Ht 69.0 in | Wt 260.0 lb

## 2020-01-21 DIAGNOSIS — L301 Dyshidrosis [pompholyx]: Secondary | ICD-10-CM | POA: Diagnosis not present

## 2020-01-21 DIAGNOSIS — Z6837 Body mass index (BMI) 37.0-37.9, adult: Secondary | ICD-10-CM

## 2020-01-21 DIAGNOSIS — E782 Mixed hyperlipidemia: Secondary | ICD-10-CM | POA: Diagnosis not present

## 2020-01-21 DIAGNOSIS — E669 Obesity, unspecified: Secondary | ICD-10-CM

## 2020-01-21 DIAGNOSIS — I10 Essential (primary) hypertension: Secondary | ICD-10-CM

## 2020-01-21 DIAGNOSIS — F17200 Nicotine dependence, unspecified, uncomplicated: Secondary | ICD-10-CM

## 2020-01-21 LAB — LIPID PANEL
Cholesterol: 214 mg/dL — ABNORMAL HIGH (ref 0–200)
HDL: 31.2 mg/dL — ABNORMAL LOW (ref >39.00)
NonHDL: 183.05
Total CHOL/HDL Ratio: 7
Triglycerides: 336 mg/dL — ABNORMAL HIGH (ref 0.0–149.0)
VLDL: 67.2 mg/dL — ABNORMAL HIGH (ref 0.0–40.0)

## 2020-01-21 LAB — COMPREHENSIVE METABOLIC PANEL
ALT: 19 U/L (ref 0–53)
AST: 20 U/L (ref 0–37)
Albumin: 4.3 g/dL (ref 3.5–5.2)
Alkaline Phosphatase: 60 U/L (ref 39–117)
BUN: 13 mg/dL (ref 6–23)
CO2: 27 mEq/L (ref 19–32)
Calcium: 9.6 mg/dL (ref 8.4–10.5)
Chloride: 104 mEq/L (ref 96–112)
Creatinine, Ser: 1.09 mg/dL (ref 0.40–1.50)
GFR: 89.8 mL/min (ref >60.00)
Glucose, Bld: 103 mg/dL — ABNORMAL HIGH (ref 70–99)
Potassium: 4.1 mEq/L (ref 3.5–5.1)
Sodium: 139 mEq/L (ref 135–145)
Total Bilirubin: 0.5 mg/dL (ref 0.2–1.2)
Total Protein: 7.1 g/dL (ref 6.0–8.3)

## 2020-01-21 LAB — LDL CHOLESTEROL, DIRECT: Direct LDL: 121 mg/dL

## 2020-01-21 MED ORDER — CHANTIX STARTING MONTH PAK 0.5 MG X 11 & 1 MG X 42 PO TABS: ORAL_TABLET | ORAL | 0 refills | Status: DC

## 2020-01-21 MED ORDER — BETAMETHASONE VALERATE 0.1 % EX OINT: TOPICAL_OINTMENT | CUTANEOUS | 2 refills | Status: DC

## 2020-01-21 NOTE — Progress Notes (Signed)
HPI:  [name] is a 44 y.o. male, who is here today for chronic disease management.  He was last seen on 07/01/2019 for his CPE.  Hyperlipidemia: Pharmacologic treatment was recommended in 06/2019. He has not taken Lipitor for a month. No side effects.  He has not been consistent with following a healthful diet.  Lab Results  Component Value Date   CHOL 229 (H) 07/01/2019   HDL 45.40 07/01/2019   LDLCALC 134 (H) 08/06/2017   LDLDIRECT 130.0 07/01/2019   TRIG 237.0 (H) 07/01/2019   CHOLHDL 5 07/01/2019   He has started exercising regularly.  Hypertension: Currently he is on nonpharmacologic treatment. He is not checking BP at home. Denies severe/frequent headache, visual changes, chest pain, dyspnea, palpitation, claudication, focal weakness, or edema.  Lab Results  Component Value Date   CREATININE 0.99 07/01/2019   BUN 16 07/01/2019   NA 139 07/01/2019   K 3.9 07/01/2019   CL 107 07/01/2019   CO2 24 07/01/2019   Still smoking. He has not tried to quit in the past few years.  Pruritic rash on hands and fingers. He has had problem intermittently for years. He is not sure about exacerbating factors. Vaseline seems to help with "flaky" skin. Initially rash is erythematous with "bumps" daily becomes dry, "crack" skin. No associated arthralgias or joint erythema/edema.  Review of Systems  Constitutional: Negative for activity change, appetite change, fatigue and fever.  HENT: Negative for nosebleeds and sore throat.   Respiratory: Negative for cough and wheezing.   Gastrointestinal: Negative for abdominal pain, nausea and vomiting.  Genitourinary: Negative for decreased urine volume and hematuria.  Musculoskeletal: Negative for gait problem and myalgias.  Neurological: Negative for syncope and facial asymmetry.  Rest of ROS, see pertinent positives sand negatives in HPI  Current Outpatient Medications on File Prior to Visit  Medication Sig  Dispense Refill  . atorvastatin (LIPITOR) 10 MG tablet Take 1 tablet (10 mg total) by mouth daily. 90 tablet 3   No current facility-administered medications on file prior to visit.   Past Medical History:  Diagnosis Date  . Hyperlipidemia   . Hypertension    No Known Allergies  Social History   Socioeconomic History  . Marital status: Single    Spouse name: Not on file  . Number of children: Not on file  . Years of education: Not on file  . Highest education level: Not on file  Occupational History  . Not on file  Tobacco Use  . Smoking status: Current Every Day Smoker    Packs/day: 0.50    Years: 15.00    Pack years: 7.50    Types: Cigarettes  . Smokeless tobacco: Never Used  Substance and Sexual Activity  . Alcohol use: No  . Drug use: No  . Sexual activity: Not on file  Other Topics Concern  . Not on file  Social History Narrative  . Not on file   Social Determinants of Health   Financial Resource Strain:   . Difficulty of Paying Living Expenses:   Food Insecurity:   . Worried About Charity fundraiser in the Last Year:   . Arboriculturist in the Last Year:   Transportation Needs:   . Film/video editor (Medical):   Marland Kitchen Lack of Transportation (Non-Medical):   Physical Activity:   . Days of Exercise per Week:   . Minutes of Exercise per Session:   Stress:   . Feeling  of Stress :   Social Connections:   . Frequency of Communication with Friends and Family:   . Frequency of Social Gatherings with Friends and Family:   . Attends Religious Services:   . Active Member of Clubs or Organizations:   . Attends Banker Meetings:   Marland Kitchen Marital Status:    Vitals:   01/21/20 0828  BP: 136/70  Pulse: 83  Resp: 12  SpO2: 98%   Wt Readings from Last 3 Encounters:  01/21/20 260 lb (117.9 kg)  08/08/19 248 lb 4.8 oz (112.6 kg)  07/01/19 246 lb 6 oz (111.8 kg)   Body mass index is 38.4 kg/m.  Physical Exam  Nursing note  reviewed. Constitutional: He is oriented to person, place, and time. He appears well-developed. No distress.  HENT:  Head: Normocephalic and atraumatic.  Mouth/Throat: Oropharynx is clear and moist and mucous membranes are normal.  Eyes: Pupils are equal, round, and reactive to light. Conjunctivae are normal.  Cardiovascular: Normal rate and regular rhythm.  No murmur heard. Pulses:      Dorsalis pedis pulses are 2+ on the right side and 2+ on the left side.  Respiratory: Effort normal and breath sounds normal. No respiratory distress.  GI: Soft. He exhibits no mass. There is no hepatomegaly. There is no abdominal tenderness.  Musculoskeletal:        General: No edema.       Hands:  Lymphadenopathy:    He has no cervical adenopathy.  Neurological: He is alert and oriented to person, place, and time. He has normal strength. No cranial nerve deficit. Gait normal.  Skin: Skin is warm. Rash is not vesicular. No erythema.  In between thumbs and index fingers signs of lichenification. I do not appreciate erythematous rash. Scaly areas on hypothenar areas bilateral. See hand pictures in MS.  Psychiatric: He has a normal mood and affect.  Well groomed, good eye contact.   ASSESSMENT AND PLAN:   Francisco King was seen today for chronic disease management.  Orders Placed This Encounter  Procedures  . Comprehensive metabolic panel  . Lipid panel    Lab Results  Component Value Date   CREATININE 1.09 01/21/2020   BUN 13 01/21/2020   NA 139 01/21/2020   K 4.1 01/21/2020   CL 104 01/21/2020   CO2 27 01/21/2020   Lab Results  Component Value Date   ALT 19 01/21/2020   AST 20 01/21/2020   ALKPHOS 60 01/21/2020   BILITOT 0.5 01/21/2020   Lab Results  Component Value Date   CHOL 214 (H) 01/21/2020   HDL 31.20 (L) 01/21/2020   LDLCALC 134 (H) 08/06/2017   LDLDIRECT 121.0 01/21/2020   TRIG 336.0 (H) 01/21/2020   CHOLHDL 7 01/21/2020     Class 2 obesity with  body mass index (BMI) of 37.0 to 37.9 in adult Since his last visit he has gained about 14 pounds. We discussed benefits of wt loss. We discussed the importance of consistency with healthy diet and physical activity for prevention/better controlled of chronic illness.  Dyshidrotic eczema Educated about diagnosis, prognosis, and treatment options. He feels like ointment medication will help more. We discussed some side effects of topical steroid. Betamethasone ointment to apply on affected area at bedtime and cover hand with cotton gloves. For now he is not interested in dermatology referral but he will let me know if he decides to do so.  Mixed hyperlipidemia He has not been compliant with pharmacologic treatment.  We discussed possible CVD complications if problem is not better controlled and benefits of statin medications for prevention. Further recommendation will be given according to lab results.  Essential hypertension, benign BP has been adequately controlled with nonpharmacologic treatment. Recommend monitoring BP at home. Continue low-salt diet.  Tobacco use disorder After discussing adverse effects of tobacco use and benefits of smoking cessation, he would like to try pharmacologic treatment. We discussed a few options, he would like to try Chantix.    Return in about 6 months (around 07/23/2020) for cpe.   Amoy Steeves G. [place], MD  [facility]. [place] office.   A few things to remember from today's visit:  I will recommend resuming cholesterol medication according to lab results.  Consistency with healthy diet and physical activity recommended. Brisk walking for 15-30 min as tolerated.Be careful with injuries.  You can have your dental procedure. Cream for hands sent, let me know if you want to see a dermatologist.

## 2020-01-21 NOTE — Assessment & Plan Note (Signed)
Educated about diagnosis, prognosis, and treatment options. He feels like ointment medication will help more. We discussed some side effects of topical steroid. Betamethasone ointment to apply on affected area at bedtime and cover hand with cotton gloves. For now he is not interested in dermatology referral but he will let me know if he decides to do so.

## 2020-01-21 NOTE — Patient Instructions (Signed)
A few things to remember from today's visit:  I will recommend resuming cholesterol medication according to lab results.  Consistency with healthy diet and physical activity recommended. Brisk walking for 15-30 min as tolerated.Be careful with injuries.  You can have your dental procedure. Cream for hands sent, let me know if you want to see a dermatologist.

## 2020-01-21 NOTE — Assessment & Plan Note (Signed)
Since his last visit he has gained about 14 pounds. We discussed benefits of wt loss. We discussed the importance of consistency with healthy diet and physical activity for prevention/better controlled of chronic illness.

## 2020-01-21 NOTE — Assessment & Plan Note (Signed)
After discussing adverse effects of tobacco use and benefits of smoking cessation, he would like to try pharmacologic treatment. We discussed a few options, he would like to try Chantix.

## 2020-01-21 NOTE — Assessment & Plan Note (Signed)
BP has been adequately controlled with nonpharmacologic treatment. Recommend monitoring BP at home. Continue low-salt diet.

## 2020-01-21 NOTE — Assessment & Plan Note (Signed)
He has not been compliant with pharmacologic treatment. We discussed possible CVD complications if problem is not better controlled and benefits of statin medications for prevention. Further recommendation will be given according to lab results.

## 2020-01-26 ENCOUNTER — Other Ambulatory Visit: Payer: Self-pay

## 2020-01-26 MED ORDER — SIMVASTATIN 20 MG PO TABS: 20.0000 mg | ORAL_TABLET | Freq: Every day | ORAL | 5 refills | Status: DC

## 2020-07-23 ENCOUNTER — Encounter: Payer: 59 | Admitting: Family Medicine

## 2020-07-30 ENCOUNTER — Encounter: Payer: 59 | Admitting: Family Medicine

## 2020-08-03 ENCOUNTER — Other Ambulatory Visit: Payer: Self-pay

## 2020-08-03 ENCOUNTER — Ambulatory Visit (INDEPENDENT_AMBULATORY_CARE_PROVIDER_SITE_OTHER): Payer: 59 | Admitting: Family Medicine

## 2020-08-03 ENCOUNTER — Encounter: Payer: Self-pay | Admitting: Family Medicine

## 2020-08-03 VITALS — BP 128/70 | HR 73 | Resp 12 | Ht 69.0 in | Wt 258.0 lb

## 2020-08-03 DIAGNOSIS — Z1329 Encounter for screening for other suspected endocrine disorder: Secondary | ICD-10-CM

## 2020-08-03 DIAGNOSIS — I10 Essential (primary) hypertension: Secondary | ICD-10-CM | POA: Diagnosis not present

## 2020-08-03 DIAGNOSIS — Z1159 Encounter for screening for other viral diseases: Secondary | ICD-10-CM

## 2020-08-03 DIAGNOSIS — K0889 Other specified disorders of teeth and supporting structures: Secondary | ICD-10-CM

## 2020-08-03 DIAGNOSIS — Z0000 Encounter for general adult medical examination without abnormal findings: Secondary | ICD-10-CM

## 2020-08-03 DIAGNOSIS — Z130 Encounter for screening for diseases of the blood and blood-forming organs and certain disorders involving the immune mechanism: Secondary | ICD-10-CM

## 2020-08-03 DIAGNOSIS — F17200 Nicotine dependence, unspecified, uncomplicated: Secondary | ICD-10-CM

## 2020-08-03 DIAGNOSIS — E782 Mixed hyperlipidemia: Secondary | ICD-10-CM

## 2020-08-03 DIAGNOSIS — J351 Hypertrophy of tonsils: Secondary | ICD-10-CM

## 2020-08-03 DIAGNOSIS — Z23 Encounter for immunization: Secondary | ICD-10-CM

## 2020-08-03 DIAGNOSIS — Z6838 Body mass index (BMI) 38.0-38.9, adult: Secondary | ICD-10-CM

## 2020-08-03 DIAGNOSIS — Z13228 Encounter for screening for other metabolic disorders: Secondary | ICD-10-CM

## 2020-08-03 MED ORDER — DICLOFENAC SODIUM 25 MG PO TBEC: 25.0000 mg | DELAYED_RELEASE_TABLET | Freq: Two times a day (BID) | ORAL | 0 refills | Status: DC | PRN

## 2020-08-03 NOTE — Progress Notes (Signed)
HPI: [name] is a 44 y.o.male here today for his routine physical examination.  Last CPE: 07/01/19 He lives with his girlfriend.  Regular exercise 3 or more times per week: Jumping rope,push up among some daily. Following a healthy diet: He just came from vacations but planning on starting a healthful diet 08/06/20.  Chronic medical problems: HLD,HTN,tobacco use.  Hx of STD's: Treated for chlamydia in the past.  Today he has no concerns in this regard.  Immunization History  Administered Date(s) Administered  . Influenza,inj,Quad PF,6+ Mos 08/07/2016, 08/06/2017, 08/03/2020  . Tdap 02/05/2017   -Hep C screening: Never. Last colon cancer screening: N/A. Last prostate ca screening: N/A  Denies high alcohol intake. He is still smoking almost 1 pack/day, increase the smoking during COVID-19 pandemia.  He is not interested in quitting for now.  -Concerns and/or follow up today:  Requesting "pain medication" for occasional toothache. He has a Education officer, community but he wants to hold on dental procedures for now.  On examination today noted left hypertrophic tonsil.He has not noted sore throat and not sure if this tonsil has been bigger than right one. Negative for abnormal wt loss,night sweats, sore throat. Occasionally left submandibular pain, no swollen glands.  Hypertension on no pharmacologic treatment. Hyperlipidemia: Currently he is on simvastatin 20 mg daily. Following low-fat diet. He is tolerating medication well.  Lab Results  Component Value Date   CHOL 214 (H) 01/21/2020   HDL 31.20 (L) 01/21/2020   LDLCALC 134 (H) 08/06/2017   LDLDIRECT 121.0 01/21/2020   TRIG 336.0 (H) 01/21/2020   CHOLHDL 7 01/21/2020   Review of Systems  Constitutional: Negative for activity change, appetite change and fatigue.  HENT: Positive for dental problem. Negative for mouth sores, nosebleeds and trouble swallowing.   Eyes: Negative for redness and visual disturbance.    Respiratory: Negative for cough, shortness of breath and wheezing.   Cardiovascular: Negative for chest pain, palpitations and leg swelling.  Gastrointestinal: Negative for abdominal pain, blood in stool, nausea and vomiting.  Endocrine: Negative for cold intolerance, heat intolerance, polydipsia, polyphagia and polyuria.  Genitourinary: Negative for decreased urine volume, dysuria, genital sores, hematuria and testicular pain.  Musculoskeletal: Negative for gait problem and myalgias.  Skin: Negative for color change and rash.  Allergic/Immunologic: Positive for environmental allergies.  Neurological: Negative for syncope, weakness and headaches.  Hematological: Does not bruise/bleed easily.  Psychiatric/Behavioral: Negative for confusion. The patient is not nervous/anxious.   All other systems reviewed and are negative.  Current Outpatient Medications on File Prior to Visit  Medication Sig Dispense Refill  . betamethasone valerate ointment (VALISONE) 0.1 % Apply on affected area of hands at night and wear a cotton glove. 30 g 2  . simvastatin (ZOCOR) 20 MG tablet Take 1 tablet (20 mg total) by mouth at bedtime. 30 tablet 5  . varenicline (CHANTIX STARTING MONTH PAK) 0.5 MG X 11 & 1 MG X 42 tablet Take one 0.5 mg tablet by mouth once daily for 3 days, then increase to one 0.5 mg tablet twice daily for 4 days, then increase to one 1 mg tablet twice daily. 53 tablet 0   No current facility-administered medications on file prior to visit.   Past Medical History:  Diagnosis Date  . Hyperlipidemia   . Hypertension     Past Surgical History:  Procedure Laterality Date  . EYE SURGERY     No Known Allergies  Family History  Problem Relation Age of Onset  .  Cancer Father        lung  . Cancer Mother        breast  . Hypertension Mother   . Cancer Sister        breast  . Diabetes Maternal Grandmother   . Hypertension Maternal Grandmother     Social History   Socioeconomic  History  . Marital status: Single    Spouse name: Not on file  . Number of children: Not on file  . Years of education: Not on file  . Highest education level: Not on file  Occupational History  . Not on file  Tobacco Use  . Smoking status: Current Every Day Smoker    Packs/day: 0.50    Years: 15.00    Pack years: 7.50    Types: Cigarettes  . Smokeless tobacco: Never Used  Substance and Sexual Activity  . Alcohol use: No  . Drug use: No  . Sexual activity: Not on file  Other Topics Concern  . Not on file  Social History Narrative  . Not on file   Social Determinants of Health   Financial Resource Strain:   . Difficulty of Paying Living Expenses: Not on file  Food Insecurity:   . Worried About Programme researcher, broadcasting/film/videounning Out of Food in the Last Year: Not on file  . Ran Out of Food in the Last Year: Not on file  Transportation Needs:   . Lack of Transportation (Medical): Not on file  . Lack of Transportation (Non-Medical): Not on file  Physical Activity:   . Days of Exercise per Week: Not on file  . Minutes of Exercise per Session: Not on file  Stress:   . Feeling of Stress : Not on file  Social Connections:   . Frequency of Communication with Friends and Family: Not on file  . Frequency of Social Gatherings with Friends and Family: Not on file  . Attends Religious Services: Not on file  . Active Member of Clubs or Organizations: Not on file  . Attends BankerClub or Organization Meetings: Not on file  . Marital Status: Not on file   Vitals:   08/03/20 0709  BP: 128/70  Pulse: 73  Resp: 12  SpO2: 98%   Body mass index is 38.1 kg/m.  Wt Readings from Last 3 Encounters:  08/03/20 258 lb (117 kg)  01/21/20 260 lb (117.9 kg)  08/08/19 248 lb 4.8 oz (112.6 kg)   Physical Exam Vitals and nursing note reviewed.  Constitutional:      General: He is not in acute distress.    Appearance: He is well-developed.  HENT:     Head: Atraumatic.     Right Ear: Tympanic membrane, ear canal and  external ear normal.     Left Ear: Tympanic membrane, ear canal and external ear normal.     Mouth/Throat:     Mouth: Mucous membranes are moist.     Pharynx: Oropharynx is clear.  Eyes:     Extraocular Movements: Extraocular movements intact.     Conjunctiva/sclera: Conjunctivae normal.     Pupils: Pupils are equal, round, and reactive to light.  Neck:     Thyroid: No thyromegaly.     Trachea: No tracheal deviation.  Cardiovascular:     Rate and Rhythm: Normal rate and regular rhythm.     Pulses:          Dorsalis pedis pulses are 2+ on the right side and 2+ on the left side.     Heart  sounds: No murmur heard.   Pulmonary:     Effort: Pulmonary effort is normal. No respiratory distress.     Breath sounds: Normal breath sounds.  Abdominal:     Palpations: Abdomen is soft. There is no hepatomegaly or mass.     Tenderness: There is no abdominal tenderness.  Genitourinary:    Comments: No concerns. Musculoskeletal:        General: No tenderness.     Cervical back: Normal range of motion.     Comments: No major deformities appreciated and no signs of synovitis.  Lymphadenopathy:     Cervical: No cervical adenopathy.     Upper Body:     Right upper body: No supraclavicular adenopathy.     Left upper body: No supraclavicular adenopathy.  Skin:    General: Skin is warm.     Findings: No erythema.  Neurological:     General: No focal deficit present.     Mental Status: He is alert and oriented to person, place, and time.     Cranial Nerves: No cranial nerve deficit.     Sensory: No sensory deficit.     Coordination: Coordination normal.     Gait: Gait normal.     Deep Tendon Reflexes:     Reflex Scores:      Bicep reflexes are 2+ on the right side and 2+ on the left side.      Patellar reflexes are 2+ on the right side and 2+ on the left side.   ASSESSMENT AND PLAN:  Mr. Landen was seen today for annual exam.  Diagnoses and all orders for this visit: Orders Placed  This Encounter  Procedures  . Flu Vaccine QUAD 36+ mos IM  . Lipid panel  . BASIC METABOLIC PANEL WITH GFR  . Hepatitis C antibody   Lab Results  Component Value Date   CREATININE 1.10 08/03/2020   BUN 9 08/03/2020   NA 140 08/03/2020   K 4.3 08/03/2020   CL 106 08/03/2020   CO2 25 08/03/2020   Lab Results  Component Value Date   CHOL 160 08/03/2020   HDL 27 (L) 08/03/2020   LDLCALC  08/03/2020     Comment:     . LDL cholesterol not calculated. Triglyceride levels greater than 400 mg/dL invalidate calculated LDL results. . Reference range: <100 . Desirable range <100 mg/dL for primary prevention;   <70 mg/dL for patients with CHD or diabetic patients  with > or = 2 CHD risk factors. Marland Kitchen LDL-C is now calculated using the Martin-Hopkins  calculation, which is a validated novel method providing  better accuracy than the Friedewald equation in the  estimation of LDL-C.  Horald Pollen et al. Lenox Ahr. 1610;960(45): 2061-2068  (http://education.[facility].com/faq/FAQ164)    LDLDIRECT 121.0 01/21/2020   TRIG 554 (H) 08/03/2020   CHOLHDL 5.9 (H) 08/03/2020    Routine general medical examination at a health care facility We discussed the importance of regular physical activity and healthy diet for prevention of chronic illness and/or complications. Preventive guidelines reviewed. Vaccination: Refused pneumovax, offered because smoker.  Next CPE in a year. The 10-year ASCVD risk score Denman George DC Jr., et al., 2013) is: 6.7%   Values used to calculate the score:     Age: 56 years     Sex: Male     Is Non-Hispanic African American: Yes     Diabetic: No     Tobacco smoker: Yes     Systolic Blood Pressure: 128 mmHg  Is BP treated: No     HDL Cholesterol: 27 mg/dL     Total Cholesterol: 160 mg/dL  Encounter for HCV screening test for low risk patient -     Hepatitis C antibody; Future  Enlarged tonsils I do not remember seeing asymmetric tonsils in the past, he does not  recall [place] discussing this problem.  So it seems new onset, given his history of tobacco use, I recommend ENT evaluation.  Toothache Recommend arranging appt with dentis to treat problem. Some side effects of NSAID's discussed.  -     diclofenac (VOLTAREN) 25 MG EC tablet; Take 1 tablet (25 mg total) by mouth 2 (two) times daily as needed (toothache).  Screening for endocrine, metabolic and immunity disorder -     BASIC METABOLIC PANEL WITH GFR; Future  Need for influenza vaccination -     Flu Vaccine QUAD 36+ mos IM  Tobacco use disorder We discussed adverse effects of tobacco use and benefits of smoking cessation.  Class 2 severe obesity due to excess calories with serious comorbidity and body mass index (BMI) of 38.0 to 38.9 in adult [facility]) We discussed benefits of wt loss as well as adverse effects of obesity. Consistency with healthy diet and physical activity recommended.   Return in 1 year (on 08/03/2021).   Milledge Gerding G. [place], MD  [facility]. [place] office.  A few things to remember from today's visit:   Essential hypertension, benign  Mixed hyperlipidemia  Encounter for HCV screening test for low risk patient  If you need refills please call your pharmacy. Do not use My Chart to request refills or for acute issues that need immediate attention.    Please be sure medication list is accurate. If a new problem present, please set up appointment sooner than planned today.   At least 150 minutes of moderate exercise per week, daily brisk walking for 15-30 min is a good exercise option. Healthy diet low in saturated (animal) fats and sweets and consisting of fresh fruits and vegetables, lean meats such as fish and white chicken and whole grains.  - Vaccines:  Tdap vaccine every 10 years.  Shingles vaccine recommended at age 33, could be given after 42 years of age but not sure about insurance coverage.  Pneumonia vaccines: Pneumovax at  665   -Screening recommendations for low/normal risk males:  Screening for diabetes at age 66 and every 3 years. Earlier screening if cardiovascular risk factors.   Lipid screening at 35 and every 3 years. Screening starts in younger males with cardiovascular risk factors.N/A  Colon cancer screening is now at age 34 but your insurance may not cover until age 36 .screening is recommended age 20.  Prostate cancer screening: some controversy, starts usually at 50: Rectal exam and PSA.  Aortic Abdominal Aneurism once between 79 and 42 years old if ever smoker.  Also recommended:  1. Dental visit- Brush and floss your teeth twice daily; visit your dentist twice a year. 2. Eye doctor- Get an eye exam at least every 2 years. 3. Helmet use- Always wear a helmet when riding a bicycle, motorcycle, rollerblading or skateboarding. 4. Safe sex- If you may be exposed to sexually transmitted infections, use a condom. 5. Seat belts- Seat belts can save your live; always wear one. 6. Smoke/Carbon Monoxide detectors- These detectors need to be installed on the appropriate level of your home. Replace batteries at least once a year. 7. Skin cancer- When out in the sun please cover  up and use sunscreen 15 SPF or higher. 8. Violence- If anyone is threatening or hurting you, please tell your healthcare provider.  9. Drink alcohol in moderation- Limit alcohol intake to one drink or less per day. Never drink and drive.

## 2020-08-03 NOTE — Assessment & Plan Note (Signed)
He lost a couple pounds since his last visit, 01/2020.  Since his last CPE, he has gained about 10 pounds. We discussed benefits of wt loss as well as adverse effects of obesity. Consistency with healthy diet and physical activity recommended.

## 2020-08-03 NOTE — Assessment & Plan Note (Signed)
Continue simvastatin 20 mg daily and low-fat diet. Further recommendation will be given according to lab results.

## 2020-08-03 NOTE — Patient Instructions (Signed)
A few things to remember from today's visit:   Essential hypertension, benign  Mixed hyperlipidemia  Encounter for HCV screening test for low risk patient  If you need refills please call your pharmacy. Do not use My Chart to request refills or for acute issues that need immediate attention.    Please be sure medication list is accurate. If a new problem present, please set up appointment sooner than planned today.   At least 150 minutes of moderate exercise per week, daily brisk walking for 15-30 min is a good exercise option. Healthy diet low in saturated (animal) fats and sweets and consisting of fresh fruits and vegetables, lean meats such as fish and white chicken and whole grains.  - Vaccines:  Tdap vaccine every 10 years.  Shingles vaccine recommended at age 11, could be given after 42 years of age but not sure about insurance coverage.  Pneumonia vaccines: Pneumovax at 665   -Screening recommendations for low/normal risk males:  Screening for diabetes at age 66 and every 3 years. Earlier screening if cardiovascular risk factors.   Lipid screening at 35 and every 3 years. Screening starts in younger males with cardiovascular risk factors.N/A  Colon cancer screening is now at age 62 but your insurance may not cover until age 50 .screening is recommended age 35.  Prostate cancer screening: some controversy, starts usually at 50: Rectal exam and PSA.  Aortic Abdominal Aneurism once between 62 and 38 years old if ever smoker.  Also recommended:  1. Dental visit- Brush and floss your teeth twice daily; visit your dentist twice a year. 2. Eye doctor- Get an eye exam at least every 2 years. 3. Helmet use- Always wear a helmet when riding a bicycle, motorcycle, rollerblading or skateboarding. 4. Safe sex- If you may be exposed to sexually transmitted infections, use a condom. 5. Seat belts- Seat belts can save your live; always wear one. 6. Smoke/Carbon Monoxide  detectors- These detectors need to be installed on the appropriate level of your home. Replace batteries at least once a year. 7. Skin cancer- When out in the sun please cover up and use sunscreen 15 SPF or higher. 8. Violence- If anyone is threatening or hurting you, please tell your healthcare provider.  9. Drink alcohol in moderation- Limit alcohol intake to one drink or less per day. Never drink and drive.

## 2020-08-03 NOTE — Assessment & Plan Note (Signed)
We discussed some adverse effects of tobacco use. Encouraged to quit. For now he is not interested in smoking cessation.

## 2020-08-03 NOTE — Assessment & Plan Note (Signed)
Problem has been well controlled with nonpharmacologic treatment. Continue low salt diet. We discussed some side effects of chronic NSAID use. Recommend monitoring BP regularly.

## 2020-08-04 LAB — LIPID PANEL
Cholesterol: 160 mg/dL (ref <200)
HDL: 27 mg/dL — ABNORMAL LOW (ref >=40)
Non-HDL Cholesterol (Calc): 133 mg/dL (calc) — ABNORMAL HIGH (ref <130)
Total CHOL/HDL Ratio: 5.9 (calc) — ABNORMAL HIGH (ref <5.0)
Triglycerides: 554 mg/dL — ABNORMAL HIGH (ref <150)

## 2020-08-04 LAB — BASIC METABOLIC PANEL WITH GFR
BUN: 9 mg/dL (ref 7–25)
CO2: 25 mmol/L (ref 20–32)
Calcium: 9.4 mg/dL (ref 8.6–10.3)
Chloride: 106 mmol/L (ref 98–110)
Creat: 1.1 mg/dL (ref 0.60–1.35)
GFR, Est African American: 95 mL/min/{1.73_m2} (ref >=60)
GFR, Est Non African American: 82 mL/min/{1.73_m2} (ref >=60)
Glucose, Bld: 96 mg/dL (ref 65–99)
Potassium: 4.3 mmol/L (ref 3.5–5.3)
Sodium: 140 mmol/L (ref 135–146)

## 2020-08-04 LAB — HEPATITIS C ANTIBODY
Hepatitis C Ab: NONREACTIVE
SIGNAL TO CUT-OFF: 0.01 (ref <1.00)

## 2020-08-05 MED ORDER — SIMVASTATIN 20 MG PO TABS: 20.0000 mg | ORAL_TABLET | Freq: Every day | ORAL | 2 refills | Status: AC

## 2021-05-10 ENCOUNTER — Ambulatory Visit (INDEPENDENT_AMBULATORY_CARE_PROVIDER_SITE_OTHER): Payer: 59 | Admitting: Family Medicine

## 2021-05-10 ENCOUNTER — Encounter: Payer: Self-pay | Admitting: Family Medicine

## 2021-05-10 ENCOUNTER — Other Ambulatory Visit: Payer: Self-pay

## 2021-05-10 VITALS — BP 128/70 | HR 64 | Resp 16 | Ht 69.0 in

## 2021-05-10 DIAGNOSIS — E782 Mixed hyperlipidemia: Secondary | ICD-10-CM | POA: Diagnosis not present

## 2021-05-10 DIAGNOSIS — K0889 Other specified disorders of teeth and supporting structures: Secondary | ICD-10-CM

## 2021-05-10 DIAGNOSIS — L301 Dyshidrosis [pompholyx]: Secondary | ICD-10-CM | POA: Diagnosis not present

## 2021-05-10 MED ORDER — BETAMETHASONE DIPROPIONATE 0.05 % EX CREA: TOPICAL_CREAM | Freq: Every day | CUTANEOUS | 1 refills | Status: AC

## 2021-05-10 MED ORDER — DICLOFENAC SODIUM 25 MG PO TBEC: 25.0000 mg | DELAYED_RELEASE_TABLET | Freq: Two times a day (BID) | ORAL | 0 refills | Status: AC | PRN

## 2021-05-10 MED ORDER — ICOSAPENT ETHYL 1 G PO CAPS: 2.0000 g | ORAL_CAPSULE | Freq: Two times a day (BID) | ORAL | 3 refills | Status: AC

## 2021-05-10 NOTE — Progress Notes (Signed)
Chief Complaint  Patient presents with   Rash   HPI: Francisco King is a 44 y.o. male, who is here today complaining of 2 years of pruritic rash affecting hands. Problem is intermittent and getting worse. He has been using betamethasone ointment, which helps temporarily. Initially lesions present like small vesicles with clear fluid, then burst,and finally he develops dry/peeling on affected area. A few days ago a lesion on left palm was mildly tender and he had erythema extending to forearm, this has resolved.  He has not identified exacerbating or alleviating factors. Negative for associated fever, oral lesions, cough, wheezing, dyspnea, or arthritis.  He is also asking for last FLP results. He heard the message,did not call back. Currently he is on simvastatin 20 mg daily.  Lab Results  Component Value Date   CHOL 160 08/03/2020   HDL 27 (L) 08/03/2020   LDLCALC  08/03/2020     Comment:     . LDL cholesterol not calculated. Triglyceride levels greater than 400 mg/dL invalidate calculated LDL results. . Reference range: <100 . Desirable range <100 mg/dL for primary prevention;   <70 mg/dL for patients with CHD or diabetic patients  with > or = 2 CHD risk factors. Marland Kitchen LDL-C is now calculated using the Martin-Hopkins  calculation, which is a validated novel method providing  better accuracy than the Friedewald equation in the  estimation of LDL-C.  Horald Pollen et al. Lenox Ahr. 0786;754(49): 2061-2068  (http://education.[facility].com/faq/FAQ164)    LDLDIRECT 121.0 01/21/2020   TRIG 554 (H) 08/03/2020   CHOLHDL 5.9 (H) 08/03/2020   He drinks alcohol, not daily but states that when he drinks it is a moderate amount. He has not been consistent with following a healthful diet.  He is also requesting refills on diclofenac, which was prescribed in 07/2020 to help with dental pain.  He has not yet established with a dentist, he has intermittent episodes of tooth  ache, he feels it may be wisdom teeth. He has not noted gum erythema/edema or sore throat. Negative for TMJ pain.  Review of Systems  Constitutional:  Negative for appetite change and fatigue.  Respiratory:  Negative for stridor.   Cardiovascular:  Negative for chest pain and palpitations.  Gastrointestinal:  Negative for abdominal pain, nausea and vomiting.  Musculoskeletal:  Negative for gait problem and myalgias.  Skin:  Negative for pallor and wound.  Allergic/Immunologic: Negative for environmental allergies.  Neurological:  Negative for syncope, weakness and numbness.  Hematological:  Negative for adenopathy. Does not bruise/bleed easily.  Rest see pertinent positives and negatives per HPI.  Current Outpatient Medications on File Prior to Visit  Medication Sig Dispense Refill   simvastatin (ZOCOR) 20 MG tablet Take 1 tablet (20 mg total) by mouth at bedtime. 90 tablet 2   No current facility-administered medications on file prior to visit.   Past Medical History:  Diagnosis Date   Hyperlipidemia    Hypertension    No Known Allergies  Social History   Socioeconomic History   Marital status: Single    Spouse name: Not on file   Number of children: Not on file   Years of education: Not on file   Highest education level: Not on file  Occupational History   Not on file  Tobacco Use   Smoking status: Every Day    Packs/day: 0.50    Years: 15.00    Pack years: 7.50    Types: Cigarettes   Smokeless tobacco: Never  Substance and  Sexual Activity   Alcohol use: No   Drug use: No   Sexual activity: Not on file  Other Topics Concern   Not on file  Social History Narrative   Not on file   Social Determinants of Health   Financial Resource Strain: Not on file  Food Insecurity: Not on file  Transportation Needs: Not on file  Physical Activity: Not on file  Stress: Not on file  Social Connections: Not on file   Vitals:   05/10/21 1207  BP: 128/70  Pulse: 64   Resp: 16  SpO2: 98%   Body mass index is 38.1 kg/m.  Physical Exam Vitals and nursing note reviewed.  Constitutional:      General: He is not in acute distress.    Appearance: He is well-developed.  HENT:     Head: Normocephalic and atraumatic.     Mouth/Throat:     Mouth: Mucous membranes are moist.     Pharynx: Oropharynx is clear.     Comments: I do not appreciate gum inflammation. Eyes:     Conjunctiva/sclera: Conjunctivae normal.  Cardiovascular:     Rate and Rhythm: Normal rate and regular rhythm.  Pulmonary:     Effort: Pulmonary effort is normal. No respiratory distress.     Breath sounds: Normal breath sounds.  Lymphadenopathy:     Cervical: No cervical adenopathy.  Skin:    General: Skin is warm.     Findings: Rash present. No erythema. Rash is not vesicular.     Comments: Hands: Confluent non erythematous micropapular and dry scaly areas on palms and around fingers, bilateral. Not tenderness or local heat.  Psychiatric:     Comments: Well groomed, good eye contact.   ASSESSMENT AND PLAN:  Mr. Corp was seen today for rash.  Diagnoses and all orders for this visit: Orders Placed This Encounter  Procedures   Ambulatory referral to Dermatology   Dyshidrotic eczema Educated about diagnosis. We discussed all possible etiologies. Reporting that problem is getting worse, so dermatology referral placed. Change from ointment to cream, betamethasone to apply at bedtime, instructed to wear cotton gloves. We discussed some side effects of chronic topical steroid use.  -     betamethasone dipropionate 0.05 % cream; Apply topically daily.  Toothache Strongly recommend arranging appointment with dentist. We discussed some side effects of NSAID use.  -     diclofenac (VOLTAREN) 25 MG EC tablet; Take 1 tablet (25 mg total) by mouth 2 (two) times daily as needed (toothache).  Mixed hyperlipidemia Triglycerides getting worse. Instructed to decrease  alcohol use and to follow a low-fat diet. Vascepa 2 g bid recommended. Continue simvastatin 20 mg daily.  -     icosapent Ethyl (VASCEPA) 1 g capsule; Take 2 capsules (2 g total) by mouth 2 (two) times daily.  Return in about 3 months (around 08/10/2021) for cpe.  Lennex Pietila G. [place], MD  [facility]. [place] office.

## 2021-05-10 NOTE — Patient Instructions (Addendum)
A few things to remember from today's visit:   Dyshidrotic eczema - Plan: betamethasone dipropionate 0.05 % cream, Ambulatory referral to Dermatology  Toothache - Plan: diclofenac (VOLTAREN) 25 MG EC tablet  Mixed hyperlipidemia  If you need refills please call your pharmacy. Do not use My Chart to request refills or for acute issues that need immediate attention.   Please arrange appt with dentist. Apply cream at night and wear a cotton glove. Dermatology appt is going to be arranged.  Low fat diet. Vasepa started today to add to Simvastatin.  Please be sure medication list is accurate. If a new problem present, please set up appointment sooner than planned today.

## 2021-05-10 NOTE — Progress Notes (Unsigned)
Received prior authorization request for Vascepa 1 GM capsules. Prior auth initiated through cover my meds, determination pending. Key: [id number]

## 2021-05-11 ENCOUNTER — Telehealth: Payer: Self-pay | Admitting: Family Medicine

## 2021-05-11 NOTE — Telephone Encounter (Signed)
Patient called and said that a dermatologist from [place] called him to schedule. Patient is wanting to know if there is a dermatologist in [place] that he can be referred to instead.  Also, he says that he got a message stating that his pharmacy is trying to find an alternative to one of his medications and wants to know if Dr. [place] can send over an alternative.  Patient's contact number is [phone].

## 2021-08-05 ENCOUNTER — Encounter: Payer: 59 | Admitting: Family Medicine

## 2021-09-02 NOTE — Patient Instructions (Addendum)
A few things to remember from today's visit:  Routine general medical examination at a health care facility  Essential hypertension, benign  Mixed hyperlipidemia - Plan: Lipid panel  Class 2 severe obesity due to excess calories with serious comorbidity and body mass index (BMI) of 38.0 to 38.9 in adult [facility]), Chronic  Screening for endocrine, metabolic and immunity disorder - Plan: Comprehensive metabolic panel, Hemoglobin A1c  If you need refills please call your pharmacy. Do not use My Chart to request refills or for acute issues that need immediate attention.   Please be sure medication list is accurate. If a new problem present, please set up appointment sooner than planned today.  We have ordered labs or studies at this visit.  It can take up to 1-2 weeks for results and processing. IF results require follow up or explanation, we will call you with instructions. Clinically stable results will be released to your [facility]. If you have not heard from [place] or cannot find your results in [facility] in 2 weeks please contact our office at [phone].  If you are not yet signed up for [facility], please consider signing up  At least 150 minutes of moderate exercise per week, daily brisk walking for 15-30 min is a good exercise option. Healthy diet low in saturated (animal) fats and sweets and consisting of fresh fruits and vegetables, lean meats such as fish and white chicken and whole grains.  - Vaccines: Tdap vaccine every 10 years.  Shingles vaccine recommended at age 44, could be given after 43 years of age but not sure about insurance coverage.  Pneumonia vaccines: Pneumovax at 20  -Screening recommendations for low/normal risk males:  Screening for diabetes at age 34 and every 3 years. Earlier screening if cardiovascular risk factors.   Lipid screening at 35 and every 3 years. Screening starts in younger males with cardiovascular risk factors.  Colon cancer screening is now at  age 50 but your insurance may not cover until age 34 .screening is recommended age 80.  Prostate cancer screening: some controversy, starts usually at 50: Rectal exam and PSA. Aortic Abdominal Aneurism once between 75 and 98 years old if ever smoker.  Also recommended: Dental visit- Brush and floss your teeth twice daily; visit your dentist twice a year. Eye doctor- Get an eye exam at least every 2 years. Helmet use- Always wear a helmet when riding a bicycle, motorcycle, rollerblading or skateboarding. Safe sex- If you may be exposed to sexually transmitted infections, use a condom. Seat belts- Seat belts can save your live; always wear one. Smoke/Carbon Monoxide detectors- These detectors need to be installed on the appropriate level of your home. Replace batteries at least once a year. Skin cancer- When out in the sun please cover up and use sunscreen 15 SPF or higher. Violence- If anyone is threatening or hurting you, please tell your healthcare provider.  Drink alcohol in moderation- Limit alcohol intake to one drink or less per day. Never drink and drive.   Fat and Cholesterol Restricted Eating Plan Eating a diet that limits fat and cholesterol may help lower your risk for heart disease and other conditions. Your body needs fat and cholesterol for basic functions, but eating too much of these things can be harmful to your health. Your health care provider may order lab tests to check your blood fat (lipid) and cholesterol levels. This helps your health care provider understand your risk for certain conditions and whether you need to make diet changes.  Work with your health care provider or dietitian to make an eating plan that is right for you. What are tips for following this plan? General guidelines  If you are overweight, work with your health care provider to lose weight safely. Losing just 5-10% of your body weight can improve your overall health and help prevent diseases such as  diabetes and heart disease. Avoid: Foods with added sugar. Fried foods. Foods that contain partially hydrogenated oils, including stick margarine, some tub margarines, cookies, crackers, and other baked goods. Limit alcohol intake to no more than 1 drink a day for nonpregnant women and 2 drinks a day for men. One drink equals 12 oz of beer, 5 oz of wine, or 1 oz of hard liquor. Reading food labels Check food labels for: Trans fats, partially hydrogenated oils, or high amounts of saturated fat. Avoid foods that contain saturated fat and trans fat. The amount of cholesterol in each serving. Try to eat no more than 200 mg of cholesterol each day. The amount of fiber in each serving. Try to eat at least 20-30 g of fiber each day. Choose foods with healthy fats, such as: Monounsaturated and polyunsaturated fats. These include olive and canola oil, flaxseeds, walnuts, almonds, and seeds. Omega-3 fats. These are found in foods such as salmon, mackerel, sardines, tuna, flaxseed oil, and ground flaxseeds. Choose grain products that have whole grains. Look for the word "whole" as the first word in the ingredient list. Cooking Cook foods using methods other than frying. Baking, boiling, grilling, and broiling are some healthy options. Eat more home-cooked food and less restaurant, buffet, and fast food. Avoid cooking using saturated fats. Animal sources of saturated fats include meats, butter, and cream. Plant sources of saturated fats include palm oil, palm kernel oil, and coconut oil. Meal planning  At meals, imagine dividing your plate into fourths: Fill one-half of your plate with vegetables and green salads. Fill one-fourth of your plate with whole grains. Fill one-fourth of your plate with lean protein foods. Eat fish that is high in omega-3 fats at least two times a week. Eat more foods that contain fiber, such as whole grains, beans, apples, broccoli, carrots, peas, and barley. These foods  help promote healthy cholesterol levels in the blood. Recommended foods Grains Whole grains, such as whole wheat or whole grain breads, crackers, cereals, and pasta. Unsweetened oatmeal, bulgur, barley, quinoa, or brown rice. Corn or whole wheat flour tortillas. Vegetables Fresh or frozen vegetables (raw, steamed, roasted, or grilled). Green salads. Fruits All fresh, canned (in natural juice), or frozen fruits. Meats and other protein foods Ground beef (85% or leaner), grass-fed beef, or beef trimmed of fat. Skinless chicken or [place]. Ground chicken or [place]. Pork trimmed of fat. All fish and seafood. Egg whites. Dried beans, peas, or lentils. Unsalted nuts or seeds. Unsalted canned beans. Natural nut butters without added sugar and oil. Dairy Low-fat or nonfat dairy products, such as skim or 1% milk, 2% or reduced-fat cheeses, low-fat and fat-free ricotta or cottage cheese, or plain low-fat and nonfat yogurt. Fats and oils Tub margarine without trans fats. Light or reduced-fat mayonnaise and salad dressings. Avocado. Olive, canola, sesame, or safflower oils. The items listed above may not be a complete list of foods and beverages you can eat. Contact a dietitian for more information. Foods to avoid Grains White bread. White pasta. White rice. Cornbread. Bagels, pastries, and croissants. Crackers and snack foods that contain trans fat and hydrogenated oils. Vegetables Vegetables cooked  in cheese, cream, or butter sauce. Fried vegetables. Fruits Canned fruit in heavy syrup. Fruit in cream or butter sauce. Fried fruit. Meats and other protein foods Fatty cuts of meat. Ribs, chicken wings, bacon, sausage, bologna, salami, chitterlings, fatback, hot dogs, bratwurst, and packaged lunch meats. Liver and organ meats. Whole eggs and egg yolks. Chicken and [place] with skin. Fried meat. Dairy Whole or 2% milk, cream, half-and-half, and cream cheese. Whole milk cheeses. Whole-fat or sweetened yogurt.  Full-fat cheeses. Nondairy creamers and whipped toppings. Processed cheese, cheese spreads, and cheese curds. Beverages Alcohol. Sugar-sweetened drinks such as sodas, lemonade, and fruit drinks. Fats and oils Butter, stick margarine, lard, shortening, ghee, or bacon fat. Coconut, palm kernel, and palm oils. Sweets and desserts Corn syrup, sugars, honey, and molasses. Candy. Jam and jelly. Syrup. Sweetened cereals. Cookies, pies, cakes, donuts, muffins, and ice cream. The items listed above may not be a complete list of foods and beverages you should avoid. Contact a dietitian for more information. Summary Your body needs fat and cholesterol for basic functions. However, eating too much of these things can be harmful to your health. Work with your health care provider and dietitian to follow a diet low in fat and cholesterol. Doing this may help lower your risk for heart disease and other conditions. Choose healthy fats, such as monounsaturated and polyunsaturated fats, and foods high in omega-3 fatty acids. Eat fiber-rich foods, such as whole grains, beans, peas, fruits, and vegetables. Limit or avoid alcohol, fried foods, and foods high in saturated fats, partially hydrogenated oils, and sugar. This information is not intended to replace advice given to you by your health care provider. Make sure you discuss any questions you have with your health care provider. Document Revised: 06/23/2020 Document Reviewed: 02/25/2020 Elsevier Patient Education  2022 [employer].

## 2021-09-02 NOTE — Progress Notes (Signed)
HPI: Mr. Naythan Douthit is a 45 y.o.male here today for his routine physical examination and follow up.  Last CPE: 08/03/20 He lives with his girlfriend.  Regular exercise 3 or more times per week: Playing basketball Sundays, biking sometimes,and active with yard work and with his job. Following a healthy diet: Yes, he is cooking at home. Smoothie for lunch and a meal for supper.  Chronic medical problems: HLD,HTN,eczema,tobacco use.   Hx of STD's: Treated for chlamydia in the past.  Today he has no concerns in this regard.  Immunization History  Administered Date(s) Administered   Influenza,inj,Quad PF,6+ Mos 08/07/2016, 08/06/2017, 08/03/2020, 09/05/2021   PFIZER Comirnaty(Gray Top)Covid-19 Tri-Sucrose Vaccine 01/08/2020, 01/29/2020   Tdap 02/05/2017   -Hep C screening: 08/03/20 NR  Last colon cancer screening: N/A Last prostate ca screening: N/A  -Negative for high alcohol intake. Once per month. He is still smoking almost 1 pack/day. He is not interested in quitting for now.  -Concerns and/or follow up today:   Hypertension on no pharmacologic treatment. Lab Results  Component Value Date   CREATININE 1.10 08/03/2020   BUN 9 08/03/2020   NA 140 08/03/2020   K 4.3 08/03/2020   CL 106 08/03/2020   CO2 25 08/03/2020   Hyperlipidemia: He stopped simvastatin 20 mg and did not start Vascepa 1 g. He thinks simvastatin was causing facial "bumps.". Following low-fat diet, he has fried chicken every 2 weeks. He is tolerating medication well.  Lab Results  Component Value Date   CHOL 160 08/03/2020   HDL 27 (L) 08/03/2020   LDLCALC  08/03/2020     Comment:     . LDL cholesterol not calculated. Triglyceride levels greater than 400 mg/dL invalidate calculated LDL results. . Reference range: <100 . Desirable range <100 mg/dL for primary prevention;   <70 mg/dL for patients with CHD or diabetic patients  with > or = 2 CHD risk factors. Marland Kitchen LDL-C is now  calculated using the Martin-Hopkins  calculation, which is a validated novel method providing  better accuracy than the Friedewald equation in the  estimation of LDL-C.  Horald Pollen et al. Lenox Ahr. 4132;440(10): 2061-2068  (http://education.[facility].com/faq/FAQ164)    LDLDIRECT 121.0 01/21/2020   TRIG 554 (H) 08/03/2020   CHOLHDL 5.9 (H) 08/03/2020   Review of Systems  Constitutional:  Negative for activity change, appetite change, fatigue and fever.  HENT:  Negative for mouth sores, nosebleeds, sore throat, trouble swallowing and voice change.   Eyes:  Negative for redness and visual disturbance.  Respiratory:  Negative for cough, shortness of breath and wheezing.   Cardiovascular:  Negative for chest pain, palpitations and leg swelling.  Gastrointestinal:  Negative for abdominal pain, blood in stool, nausea and vomiting.  Endocrine: Negative for cold intolerance, heat intolerance, polydipsia, polyphagia and polyuria.  Genitourinary:  Negative for decreased urine volume, dysuria, genital sores, hematuria and testicular pain.  Musculoskeletal:  Negative for gait problem and myalgias.  Skin:  Negative for color change and rash.  Allergic/Immunologic: Positive for environmental allergies.  Neurological:  Negative for syncope, weakness and headaches.  Hematological:  Negative for adenopathy. Does not bruise/bleed easily.  Psychiatric/Behavioral:  Negative for confusion. The patient is not nervous/anxious.   All other systems reviewed and are negative.  Current Outpatient Medications on File Prior to Visit  Medication Sig Dispense Refill   betamethasone dipropionate 0.05 % cream Apply topically daily. 30 g 1   diclofenac (VOLTAREN) 25 MG EC tablet Take 1 tablet (25 mg total) by  mouth 2 (two) times daily as needed (toothache). 30 tablet 0   icosapent Ethyl (VASCEPA) 1 g capsule Take 2 capsules (2 g total) by mouth 2 (two) times daily. 120 capsule 3   simvastatin (ZOCOR) 20 MG tablet  Take 1 tablet (20 mg total) by mouth at bedtime. 90 tablet 2   No current facility-administered medications on file prior to visit.   Past Medical History:  Diagnosis Date   Hyperlipidemia    Hypertension    Past Surgical History:  Procedure Laterality Date   EYE SURGERY     No Known Allergies  Family History  Problem Relation Age of Onset   Cancer Father        lung   Cancer Mother        breast   Hypertension Mother    Cancer Sister        breast   Diabetes Maternal Grandmother    Hypertension Maternal Grandmother    Social History   Socioeconomic History   Marital status: Single    Spouse name: Not on file   Number of children: Not on file   Years of education: Not on file   Highest education level: Not on file  Occupational History   Not on file  Tobacco Use   Smoking status: Every Day    Packs/day: 0.50    Years: 15.00    Pack years: 7.50    Types: Cigarettes   Smokeless tobacco: Never  Substance and Sexual Activity   Alcohol use: No   Drug use: No   Sexual activity: Not on file  Other Topics Concern   Not on file  Social History Narrative   Not on file   Social Determinants of Health   Financial Resource Strain: Not on file  Food Insecurity: Not on file  Transportation Needs: Not on file  Physical Activity: Not on file  Stress: Not on file  Social Connections: Not on file   Vitals:   09/05/21 0708  BP: 128/80  Pulse: 77  Resp: 16  Temp: 97.6 F (36.4 C)  SpO2: 98%   Body mass index is 39.43 kg/m.  Wt Readings from Last 3 Encounters:  09/05/21 267 lb (121.1 kg)  08/03/20 258 lb (117 kg)  01/21/20 260 lb (117.9 kg)   Physical Exam Vitals and nursing note reviewed.  Constitutional:      General: He is not in acute distress.    Appearance: He is well-developed.  HENT:     Head: Normocephalic and atraumatic.     Right Ear: Tympanic membrane, ear canal and external ear normal.     Left Ear: Tympanic membrane, ear canal and  external ear normal.     Mouth/Throat:     Mouth: Mucous membranes are moist.     Pharynx: Oropharynx is clear.      Comments: Lef tonsil hypertrophy reported as unchanged. Eyes:     Extraocular Movements: Extraocular movements intact.     Conjunctiva/sclera: Conjunctivae normal.     Pupils: Pupils are equal, round, and reactive to light.  Neck:     Thyroid: No thyromegaly.     Trachea: No tracheal deviation.  Cardiovascular:     Rate and Rhythm: Normal rate and regular rhythm.     Pulses:          Dorsalis pedis pulses are 2+ on the right side and 2+ on the left side.     Heart sounds: No murmur heard. Pulmonary:  Effort: Pulmonary effort is normal. No respiratory distress.     Breath sounds: Normal breath sounds.  Abdominal:     Palpations: Abdomen is soft. There is no hepatomegaly or mass.     Tenderness: There is no abdominal tenderness.  Genitourinary:    Comments: No concerns. Musculoskeletal:        General: No tenderness.     Cervical back: Normal range of motion.     Comments: No major deformities appreciated and no signs of synovitis.  Lymphadenopathy:     Cervical: No cervical adenopathy.     Upper Body:     Right upper body: No supraclavicular adenopathy.     Left upper body: No supraclavicular adenopathy.  Skin:    General: Skin is warm.     Findings: No erythema.  Neurological:     Mental Status: He is alert and oriented to person, place, and time.     Cranial Nerves: No cranial nerve deficit.     Sensory: No sensory deficit.     Coordination: Coordination normal.     Gait: Gait normal.     Deep Tendon Reflexes:     Reflex Scores:      Bicep reflexes are 2+ on the right side and 2+ on the left side.      Patellar reflexes are 2+ on the right side and 2+ on the left side.  ASSESSMENT AND PLAN:  Mr.Hillel was seen today for annual exam and f/u.  Diagnoses and all orders for this visit: Orders Placed This Encounter  Procedures   Flu Vaccine QUAD  3mo+IM (Fluarix, Fluzone & Alfiuria Quad PF)   Lipid panel   Comprehensive metabolic panel   Hemoglobin A1c   LDL cholesterol, direct   Ambulatory referral to ENT   Lab Results  Component Value Date   CHOL 207 (H) 09/05/2021   HDL 33.60 (L) 09/05/2021   LDLCALC  08/03/2020     Comment:     . LDL cholesterol not calculated. Triglyceride levels greater than 400 mg/dL invalidate calculated LDL results. . Reference range: <100 . Desirable range <100 mg/dL for primary prevention;   <70 mg/dL for patients with CHD or diabetic patients  with > or = 2 CHD risk factors. Marland Kitchen LDL-C is now calculated using the Martin-Hopkins  calculation, which is a validated novel method providing  better accuracy than the Friedewald equation in the  estimation of LDL-C.  Horald Pollen et al. Lenox Ahr. 5462;703(50): 2061-2068  (http://education.[facility].com/faq/FAQ164)    LDLDIRECT 133.0 09/05/2021   TRIG 280.0 (H) 09/05/2021   CHOLHDL 6 09/05/2021   Lab Results  Component Value Date   CREATININE 1.23 09/05/2021   BUN 17 09/05/2021   NA 139 09/05/2021   K 3.9 09/05/2021   CL 106 09/05/2021   CO2 26 09/05/2021   Lab Results  Component Value Date   ALT 25 09/05/2021   AST 25 09/05/2021   ALKPHOS 61 09/05/2021   BILITOT 0.5 09/05/2021   Lab Results  Component Value Date   HGBA1C 5.8 09/05/2021   Routine general medical examination at a health care facility We discussed the importance of regular physical activity and healthy diet for prevention of chronic illness and/or complications. Preventive guidelines reviewed. Vaccination: He is not interested in Pneumovax.  Influenza vaccine given today. Smoking cessation encouraged. Next CPE in a year.  Enlarged tonsils Left one. Asymptomatic and it seems to be stable. Because of his history of tobacco use, recommend ENT evaluation.  Screening for endocrine, metabolic  and immunity disorder -     Hemoglobin A1c -     Comprehensive metabolic  panel  Need for influenza vaccination -     Flu Vaccine QUAD 51mo+IM (Fluarix, Fluzone & Alfiuria Quad PF)  Mixed hyperlipidemia Simvastatin may have caused facial acne. Low fat diet to continue. We discussed possible complications of elevated TG. Further recommendations according to FLP result.  Essential hypertension, benign BP is adequately controlled. Continue non pharmacologic treatment. Recommend monitoring BP at home regularly.  Class 2 obesity with body mass index (BMI) of 37.0 to 37.9 in adult Gained wt since his last CPE. We discussed benefits of wt loss as well as adverse effects of obesity. Consistency with healthy diet and physical activity recommended.  Return in 1 year (on 09/05/2022) for CPE, sooner follow up if abnormal labs..  Nichael Ehly G. [place], MD  [facility]. [place] office.

## 2021-09-05 ENCOUNTER — Encounter: Payer: Self-pay | Admitting: Family Medicine

## 2021-09-05 ENCOUNTER — Ambulatory Visit (INDEPENDENT_AMBULATORY_CARE_PROVIDER_SITE_OTHER): Payer: 59 | Admitting: Family Medicine

## 2021-09-05 ENCOUNTER — Other Ambulatory Visit: Payer: Self-pay

## 2021-09-05 VITALS — BP 128/80 | HR 77 | Temp 97.6°F | Resp 16 | Ht 69.0 in | Wt 267.0 lb

## 2021-09-05 DIAGNOSIS — Z130 Encounter for screening for diseases of the blood and blood-forming organs and certain disorders involving the immune mechanism: Secondary | ICD-10-CM | POA: Diagnosis not present

## 2021-09-05 DIAGNOSIS — J351 Hypertrophy of tonsils: Secondary | ICD-10-CM

## 2021-09-05 DIAGNOSIS — I10 Essential (primary) hypertension: Secondary | ICD-10-CM

## 2021-09-05 DIAGNOSIS — Z23 Encounter for immunization: Secondary | ICD-10-CM

## 2021-09-05 DIAGNOSIS — Z1329 Encounter for screening for other suspected endocrine disorder: Secondary | ICD-10-CM

## 2021-09-05 DIAGNOSIS — E782 Mixed hyperlipidemia: Secondary | ICD-10-CM

## 2021-09-05 DIAGNOSIS — Z13228 Encounter for screening for other metabolic disorders: Secondary | ICD-10-CM

## 2021-09-05 DIAGNOSIS — Z6838 Body mass index (BMI) 38.0-38.9, adult: Secondary | ICD-10-CM

## 2021-09-05 DIAGNOSIS — Z6839 Body mass index (BMI) 39.0-39.9, adult: Secondary | ICD-10-CM

## 2021-09-05 DIAGNOSIS — Z0000 Encounter for general adult medical examination without abnormal findings: Secondary | ICD-10-CM

## 2021-09-05 LAB — COMPREHENSIVE METABOLIC PANEL
ALT: 25 U/L (ref 0–53)
AST: 25 U/L (ref 0–37)
Albumin: 4.3 g/dL (ref 3.5–5.2)
Alkaline Phosphatase: 61 U/L (ref 39–117)
BUN: 17 mg/dL (ref 6–23)
CO2: 26 mEq/L (ref 19–32)
Calcium: 9 mg/dL (ref 8.4–10.5)
Chloride: 106 mEq/L (ref 96–112)
Creatinine, Ser: 1.23 mg/dL (ref 0.40–1.50)
GFR: 71.88 mL/min (ref >60.00)
Glucose, Bld: 115 mg/dL — ABNORMAL HIGH (ref 70–99)
Potassium: 3.9 mEq/L (ref 3.5–5.1)
Sodium: 139 mEq/L (ref 135–145)
Total Bilirubin: 0.5 mg/dL (ref 0.2–1.2)
Total Protein: 6.8 g/dL (ref 6.0–8.3)

## 2021-09-05 LAB — HEMOGLOBIN A1C: Hgb A1c MFr Bld: 5.8 % (ref 4.6–6.5)

## 2021-09-05 LAB — LIPID PANEL
Cholesterol: 207 mg/dL — ABNORMAL HIGH (ref 0–200)
HDL: 33.6 mg/dL — ABNORMAL LOW (ref >39.00)
NonHDL: 173.21
Total CHOL/HDL Ratio: 6
Triglycerides: 280 mg/dL — ABNORMAL HIGH (ref 0.0–149.0)
VLDL: 56 mg/dL — ABNORMAL HIGH (ref 0.0–40.0)

## 2021-09-05 LAB — LDL CHOLESTEROL, DIRECT: Direct LDL: 133 mg/dL

## 2021-09-05 NOTE — Assessment & Plan Note (Signed)
Simvastatin may have caused facial acne. Low fat diet to continue. We discussed possible complications of elevated TG. Further recommendations according to FLP result.

## 2021-09-05 NOTE — Assessment & Plan Note (Signed)
BP is adequately controlled. Continue non pharmacologic treatment. Recommend monitoring BP at home regularly.

## 2021-09-05 NOTE — Assessment & Plan Note (Signed)
Gained wt since his last CPE. We discussed benefits of wt loss as well as adverse effects of obesity. Consistency with healthy diet and physical activity recommended.
